# Patient Record
Sex: Male | Born: 1935 | Race: White | Hispanic: No | Smoking: Former smoker
Health system: Southern US, Community
[De-identification: ages and names within clinical notes are randomized; demographics above are authoritative.]

## PROBLEM LIST (undated history)

## (undated) DIAGNOSIS — J449 Chronic obstructive pulmonary disease, unspecified: Secondary | ICD-10-CM

## (undated) DIAGNOSIS — I10 Essential (primary) hypertension: Secondary | ICD-10-CM

## (undated) DIAGNOSIS — H269 Unspecified cataract: Secondary | ICD-10-CM

## (undated) HISTORY — PX: CATARACT EXTRACTION: SUR2

---

## 1966-07-27 HISTORY — PX: HAND SURGERY: SHX662

## 2017-02-06 ENCOUNTER — Encounter (HOSPITAL_COMMUNITY): Payer: Self-pay | Admitting: Emergency Medicine

## 2017-02-06 ENCOUNTER — Emergency Department (HOSPITAL_COMMUNITY)
Admission: EM | Admit: 2017-02-06 | Discharge: 2017-02-06 | Disposition: A | Discharge status: Home / Self Care | Payer: Medicare HMO | Attending: Emergency Medicine | Admitting: Emergency Medicine

## 2017-02-06 DIAGNOSIS — N3000 Acute cystitis without hematuria: Secondary | ICD-10-CM | POA: Diagnosis not present

## 2017-02-06 DIAGNOSIS — Z87891 Personal history of nicotine dependence: Secondary | ICD-10-CM | POA: Insufficient documentation

## 2017-02-06 DIAGNOSIS — I10 Essential (primary) hypertension: Secondary | ICD-10-CM | POA: Insufficient documentation

## 2017-02-06 DIAGNOSIS — R531 Weakness: Secondary | ICD-10-CM | POA: Diagnosis present

## 2017-02-06 DIAGNOSIS — J449 Chronic obstructive pulmonary disease, unspecified: Secondary | ICD-10-CM | POA: Insufficient documentation

## 2017-02-06 HISTORY — DX: Essential (primary) hypertension: I10

## 2017-02-06 HISTORY — DX: Chronic obstructive pulmonary disease, unspecified: J44.9

## 2017-02-06 LAB — URINALYSIS, ROUTINE W REFLEX MICROSCOPIC
Bilirubin Urine: NEGATIVE
GLUCOSE, UA: NEGATIVE mg/dL
Ketones, ur: NEGATIVE mg/dL
NITRITE: POSITIVE — AB
PH: 5 (ref 5.0–8.0)
Protein, ur: 100 mg/dL — AB
Specific Gravity, Urine: 1.021 (ref 1.005–1.030)

## 2017-02-06 LAB — CBC
HEMATOCRIT: 41.4 % (ref 39.0–52.0)
Hemoglobin: 14.2 g/dL (ref 13.0–17.0)
MCH: 30.1 pg (ref 26.0–34.0)
MCHC: 34.3 g/dL (ref 30.0–36.0)
MCV: 87.9 fL (ref 78.0–100.0)
PLATELETS: 193 10*3/uL (ref 150–400)
RBC: 4.71 MIL/uL (ref 4.22–5.81)
RDW: 13.7 % (ref 11.5–15.5)
WBC: 18.6 10*3/uL — AB (ref 4.0–10.5)

## 2017-02-06 LAB — BASIC METABOLIC PANEL
Anion gap: 9 (ref 5–15)
BUN: 15 mg/dL (ref 6–20)
CO2: 25 mmol/L (ref 22–32)
CREATININE: 1.19 mg/dL (ref 0.61–1.24)
Calcium: 8.7 mg/dL — ABNORMAL LOW (ref 8.9–10.3)
Chloride: 100 mmol/L — ABNORMAL LOW (ref 101–111)
GFR, EST NON AFRICAN AMERICAN: 57 mL/min — AB (ref >60)
Glucose, Bld: 137 mg/dL — ABNORMAL HIGH (ref 65–99)
POTASSIUM: 3.8 mmol/L (ref 3.5–5.1)
SODIUM: 134 mmol/L — AB (ref 135–145)

## 2017-02-06 LAB — I-STAT CG4 LACTIC ACID, ED: Lactic Acid, Venous: 1.39 mmol/L (ref 0.5–1.9)

## 2017-02-06 LAB — CBG MONITORING, ED: Glucose-Capillary: 126 mg/dL — ABNORMAL HIGH (ref 65–99)

## 2017-02-06 MED ORDER — SODIUM CHLORIDE 0.9 % IV BOLUS (SEPSIS)
1000.0000 mL | Freq: Once | INTRAVENOUS | Status: AC
  Administered 2017-02-06: 1000 mL via INTRAVENOUS

## 2017-02-06 MED ORDER — ACETAMINOPHEN 500 MG PO TABS
1000.0000 mg | ORAL_TABLET | Freq: Once | ORAL | Status: AC
  Administered 2017-02-06: 1000 mg via ORAL
  Filled 2017-02-06: qty 2

## 2017-02-06 MED ORDER — CEPHALEXIN 500 MG PO CAPS: 500.0000 mg | ORAL_CAPSULE | Freq: Three times a day (TID) | ORAL | 0 refills | Status: DC

## 2017-02-06 MED ORDER — DEXTROSE 5 % IV SOLN
1.0000 g | Freq: Once | INTRAVENOUS | Status: AC
  Administered 2017-02-06: 1 g via INTRAVENOUS
  Filled 2017-02-06: qty 10

## 2017-02-06 MED ORDER — IBUPROFEN 400 MG PO TABS
400.0000 mg | ORAL_TABLET | Freq: Once | ORAL | Status: AC
  Administered 2017-02-06: 400 mg via ORAL
  Filled 2017-02-06: qty 1

## 2017-02-06 NOTE — ED Provider Notes (Signed)
[facility]-EMERGENCY DEPT Provider Note   CSN: [id number] Arrival date & time: 02/06/17  1315     History   Chief Complaint Chief Complaint  Patient presents with  . Weakness    HPI Michael Bailey is a 81 y.o. male.  HPI  The patient is a 81 year old male, history of COPD and hypertension, and otherwise good health, lives by himself, noticed that yesterday he started having fevers chills and generalized weakness however has had some worsening symptoms this morning. He also reports being up over 15 times last night to use the bathroom, urinating frequently, dysuria but no lower back pain or abdominal pain. His family brings him to the hospital stating that he usually does not come to the hospital because he is fairly stoic and usually refuses any interventions. The patient denies having any rashes but has had a couple of tick bites over the last week.  Past Medical History:  Diagnosis Date  . COPD (chronic obstructive pulmonary disease) (HCC)   . Hypertension     There are no active problems to display for this patient.   History reviewed. No pertinent surgical history.     Home Medications    Prior to Admission medications   Medication Sig Start Date End Date Taking? Authorizing Provider  aspirin 500 MG tablet Take 500 mg by mouth every 6 (six) hours as needed for pain (BP).   Yes [provider]  thiamine (VITAMIN B-1) 100 MG tablet Take 100 mg by mouth daily.   Yes [provider]  cephALEXin (KEFLEX) 500 MG capsule Take 1 capsule (500 mg total) by mouth 3 (three) times daily. 02/06/17   Eber HongMiller, Shailene Demonbreun, MD    Family History No family history on file.  Social History Social History  Substance Use Topics  . Smoking status: Former Games developermoker  . Smokeless tobacco: Never Used  . Alcohol use No     Allergies   Patient has no known allergies.   Review of Systems Review of Systems  All other systems reviewed and are negative.    Physical Exam Updated  Vital Signs BP (!) 122/56   Pulse 84   Temp 98.3 F (36.8 C)   Resp (!) 23   Ht 6\' 2"  (1.88 m)   Wt 127 kg (280 lb)   SpO2 97%   BMI 35.95 kg/m   Physical Exam  Constitutional: He appears well-developed and well-nourished. No distress.  HENT:  Head: Normocephalic and atraumatic.  Mouth/Throat: Oropharynx is clear and moist. No oropharyngeal exudate.  Eyes: Pupils are equal, round, and reactive to light. Conjunctivae and EOM are normal. Right eye exhibits no discharge. Left eye exhibits no discharge. No scleral icterus.  Neck: Normal range of motion. Neck supple. No JVD present. No thyromegaly present.  Cardiovascular: Regular rhythm, normal heart sounds and intact distal pulses.  Exam reveals no gallop and no friction rub.   No murmur heard. Pulse of 100 on my exam, normal radial artery pulses  Pulmonary/Chest: Effort normal and breath sounds normal. No respiratory distress. He has no wheezes. He has no rales.  Abdominal: Soft. Bowel sounds are normal. He exhibits no distension and no mass. There is no tenderness.  No abdominal tenderness, very soft, no guarding, no pulsatile masses  Musculoskeletal: Normal range of motion. He exhibits no edema or tenderness.  No edema, moves all 4 extremities without any difficulty, he has the absence of 2 fingers of the right hand which she states were lost 20 was younger to an  industrial accident, all other compartments are soft, joints are supple, no effusions  Lymphadenopathy:    He has no cervical adenopathy.  Neurological: He is alert. Coordination normal.  The patient is able to interact appropriately with normal function, has normal coordination, normal strength in all 4 extremities, is able to sit up by himself in the bed for the exam, cranial nerves III through XII appear normal, there is no lateralizing weakness  Skin: Skin is warm and dry. No rash noted. No erythema.  There is no rash on the skin  Psychiatric: He has a normal mood and  affect. His behavior is normal.  Nursing note and vitals reviewed.    ED Treatments / Results  Labs (all labs ordered are listed, but only abnormal results are displayed) Labs Reviewed  BASIC METABOLIC PANEL - Abnormal; Notable for the following:       Result Value   Sodium 134 (*)    Chloride 100 (*)    Glucose, Bld 137 (*)    Calcium 8.7 (*)    GFR calc non Af Amer 57 (*)    All other components within normal limits  CBC - Abnormal; Notable for the following:    WBC 18.6 (*)    All other components within normal limits  URINALYSIS, ROUTINE W REFLEX MICROSCOPIC - Abnormal; Notable for the following:    Color, Urine AMBER (*)    APPearance CLOUDY (*)    Hgb urine dipstick MODERATE (*)    Protein, ur 100 (*)    Nitrite POSITIVE (*)    Leukocytes, UA MODERATE (*)    Bacteria, UA FEW (*)    Squamous Epithelial / LPF 0-5 (*)    Non Squamous Epithelial 0-5 (*)    All other components within normal limits  CBG MONITORING, ED - Abnormal; Notable for the following:    Glucose-Capillary 126 (*)    All other components within normal limits  URINE CULTURE  I-STAT CG4 LACTIC ACID, ED    Radiology No results found.  Procedures Procedures (including critical care time)  Medications Ordered in ED Medications  sodium chloride 0.9 % bolus 1,000 mL (0 mLs Intravenous Stopped 02/06/17 1458)  acetaminophen (TYLENOL) tablet 1,000 mg (1,000 mg Oral Given 02/06/17 1355)  ibuprofen (ADVIL,MOTRIN) tablet 400 mg (400 mg Oral Given 02/06/17 1355)  cefTRIAXone (ROCEPHIN) 1 g in dextrose 5 % 50 mL IVPB (1 g Intravenous New Bag/Given 02/06/17 1453)     Initial Impression / Assessment and Plan / ED Course  I have reviewed the triage vital signs and the nursing notes.  Pertinent labs & imaging results that were available during my care of the patient were reviewed by me and considered in my medical decision making (see chart for details).     The patient is febrile, mildly tachycardic, I  suspect he has a urinary tract infection of some kind. Urinalysis pending, he does not appear clinically toxic however he will need some IV fluids and likely antibiotics. Check renal function as well.  White blood cell count of 18,600, urinalysis shows too numerous to count white blood cells with bacteria and a positive nitrite. The patient was given Rocephin, Tylenol, IV fluids and has improved significantly. He is now asymptomatic with no back pain, no belly pain, no fever, no nausea. His vital signs of improved significantly with a pulse of 83, blood pressure is normal, lactic acid is normal, renal function is normal. I discussed all of these findings with the patient and his  family members after he gave me permission. He appears stable for discharge, culture has been sent, he will follow-up with a local family doctor. He does not have one so I have given him a follow-up list for outpatient referral. He expressed his understanding and has agreed to return should his symptoms worsen.  Final Clinical Impressions(s) / ED Diagnoses   Final diagnoses:  Acute cystitis without hematuria    New Prescriptions New Prescriptions   CEPHALEXIN (KEFLEX) 500 MG CAPSULE    Take 1 capsule (500 mg total) by mouth 3 (three) times daily.     Eber Hong, MD 02/06/17 [phone]

## 2017-02-06 NOTE — ED Triage Notes (Signed)
Pt c/o generalized weakness, dizziness x 2 days with fever/chills today. Pt also c/o dysuria and polyuria.

## 2017-02-06 NOTE — Discharge Instructions (Signed)
Please obtain all of your results from medical records or have your doctors office obtain the results - share them with your doctor - you should be seen at your doctors office in the next 2 days. Call today to arrange your follow up. Take the medications as prescribed. Please review all of the medicines and only take them if you do not have an allergy to them. Please be aware that if you are taking birth control pills, taking other prescriptions, ESPECIALLY ANTIBIOTICS may make the birth control ineffective - if this is the case, either do not engage in sexual activity or use alternative methods of birth control such as condoms until you have finished the medicine and your family doctor says it is OK to restart them. If you are on a blood thinner such as COUMADIN, be aware that any other medicine that you take may cause the coumadin to either work too much, or not enough - you should have your coumadin level rechecked in next 7 days if this is the case.  ?  It is also a possibility that you have an allergic reaction to any of the medicines that you have been prescribed - Everybody reacts differently to medications and while MOST people have no trouble with most medicines, you may have a reaction such as nausea, vomiting, rash, swelling, shortness of breath. If this is the case, please stop taking the medicine immediately and contact your physician.  ?  You should return to the ER if you develop severe or worsening symptoms.   Keflex 3 times a day (with food) for 7 days Tylenol or Motrin for fevers See a local doctor in next week for follow up.  See list below.  [facility] Primary Care Doctor List    Kari Baars MD. Specialty: Pulmonary Disease Contact information: [address]  [address]  [place] [address]  [phone]   Syliva Overman, MD. Specialty: [facility] Medicine Contact information: [address]  [place] [address]  [phone]   Lilyan Punt, MD.  Specialty: [facility] Medicine Contact information: [address]  [place] [address]  [phone]   Avon Gully, MD Specialty: Internal Medicine Contact information: [address] [place] [address]  [phone]   Catalina Pizza, MD. Specialty: Internal Medicine Contact information: [address]  [place] [address]  [phone]    [facility] (Dr. Selena Batten) Specialty: Family Medicine Contact information: [address]  [place] [address]  [phone]   John Giovanni, MD. Specialty: [facility] Medicine Contact information: [address]  [address]  [place] [address]  [phone]   Carylon Perches, MD. Specialty: Internal Medicine Contact information: [address]  [address]  [place] [address]  [phone]    [facility]  [address] [place], [place] [address] [phone]  Services [facility] offers a variety of basic health services.  Services include but are not limited to: Blood pressure checks  Heart rate checks  Blood sugar checks  Urine analysis  Rapid strep tests  Pregnancy tests.  Health education and referrals  People needing more complex services will be directed to a physician online. Using these virtual visits, doctors can evaluate and prescribe medicine and treatments. There will be no medication on-site, though [facility] will help patients fill their prescriptions at little to no cost.   For  More information please go to: [email or web address]

## 2017-02-08 LAB — URINE CULTURE

## 2017-02-09 ENCOUNTER — Telehealth: Payer: Self-pay | Admitting: *Deleted

## 2017-02-09 NOTE — Telephone Encounter (Signed)
Call back received from patient.  Bactrim DS 1 tab PO BID x 7 days [facility] [phone]

## 2017-02-09 NOTE — Progress Notes (Signed)
ED Antimicrobial Stewardship Positive Culture Follow Up   Michael Bailey is an 81 y.o. male who [facility] with a chief complaint of  Chief Complaint  Patient presents with  . Weakness    Recent Results (from the past 720 hour(s))  Urine Culture     Status: Abnormal   Collection Time: 02/06/17  1:41 PM  Result Value Ref Range Status   Specimen Description URINE, CLEAN CATCH  Final   Special Requests NONE  Final   Culture >=100,000 COLONIES/mL ENTEROBACTER AEROGENES (A)  Final   Report Status 02/08/2017 FINAL  Final   Organism ID, Bacteria ENTEROBACTER AEROGENES (A)  Final      Susceptibility   Enterobacter aerogenes - MIC*    CEFAZOLIN >=64 RESISTANT Resistant     CEFTRIAXONE <=1 SENSITIVE Sensitive     CIPROFLOXACIN <=0.25 SENSITIVE Sensitive     GENTAMICIN <=1 SENSITIVE Sensitive     IMIPENEM 2 SENSITIVE Sensitive     NITROFURANTOIN 64 INTERMEDIATE Intermediate     TRIMETH/SULFA <=20 SENSITIVE Sensitive     PIP/TAZO <=4 SENSITIVE Sensitive     * >=100,000 COLONIES/mL ENTEROBACTER AEROGENES    [x]  Treated with Keflex, organism resistant to prescribed antimicrobial  New antibiotic prescription: Bactrim DS 1 tab bid x 7 days  ED Provider: Claudine MoutonJaimie Ward, PA-C   Rolley SimsMartin, Naiara Lombardozzi Ann 02/09/2017, 9:38 AM Infectious Diseases Pharmacist Phone# [phone]

## 2017-02-09 NOTE — Telephone Encounter (Signed)
Post ED Visit - Positive Culture Follow-up: Unsuccessful Patient Follow-up  Culture assessed and recommendations reviewed by:  []  Enzo BiNathan Batchelder, Pharm.D. []  Celedonio MiyamotoJeremy Frens, Pharm.D., BCPS AQ-ID []  Garvin FilaMike Maccia, Pharm.D., BCPS [x]  Georgina PillionElizabeth Martin, Pharm.D., BCPS []  Re[place]olds[place]Minh Pham, VermontPharm.D., BCPS, AAHIVP []  Estella HuskMichelle Turner, Pharm.D., BCPS, AAHIVP []  Michael Pearlachel Rumbarger, PharmD, BCPS []  Casilda Carlsaylor Stone, PharmD, BCPS []  Pollyann SamplesAndy Johnston, PharmD, BCPS  Positive urine culture  []  Patient discharged without antimicrobial prescription and treatment is now indicated [x]  Organism is resistant to prescribed ED discharge antimicrobial []  Patient with positive blood cultures   Unable to contact patient after 3 attempts, letter will be sent to address on file  Michael Bailey, Michael Bailey 02/09/2017, 10:31 AM

## 2019-07-03 ENCOUNTER — Ambulatory Visit
Admission: EM | Admit: 2019-07-03 | Discharge: 2019-07-03 | Disposition: A | Discharge status: Home / Self Care | Payer: Medicare HMO | Attending: Emergency Medicine | Admitting: Emergency Medicine

## 2019-07-03 ENCOUNTER — Other Ambulatory Visit: Payer: Self-pay

## 2019-07-03 DIAGNOSIS — T161XXA Foreign body in right ear, initial encounter: Secondary | ICD-10-CM | POA: Diagnosis not present

## 2019-07-03 DIAGNOSIS — H9201 Otalgia, right ear: Secondary | ICD-10-CM | POA: Diagnosis not present

## 2019-07-03 DIAGNOSIS — R030 Elevated blood-pressure reading, without diagnosis of hypertension: Secondary | ICD-10-CM | POA: Diagnosis not present

## 2019-07-03 NOTE — Discharge Instructions (Signed)
Foreign body removed with ear lavage Rest and drink plenty of fluids Take medications as directed and to completion Continue to use OTC ibuprofen and/ or tylenol as needed for pain control Follow up with PCP if symptoms persists Return here or go to the ER if you have any new or worsening symptoms fever, chills, nausea, vomiting, worsening symptoms despite treatment, etc...  Blood pressure elevated in office.  Please recheck in 24 hours.  If it continues to be greater than 140/90 please follow up with PCP for further evaluation and management.

## 2019-07-03 NOTE — ED Provider Notes (Signed)
[facility]   [id number] 07/03/19 Arrival Time: 46  CC: EAR PAIN; FB in ear  SUBJECTIVE: History from: patient.  Hanley Woerner is a 83 y.o. male who presents with of right ear pain x 1 day.  Symptoms began after getting a piece of cotton swab stuck in his ear.  Patient states the pain is constant and achy in character.  3/10.  Patient has not tried removing at home. Symptoms are made worse to the touch.  Denies similar symptoms in the past.    Denies fever, chills, fatigue, sinus pain, rhinorrhea, ear discharge, sore throat, SOB, wheezing, chest pain, nausea, changes in bowel or bladder habits.    ROS: As per HPI.  All other pertinent ROS negative.     Past Medical History:  Diagnosis Date  . COPD (chronic obstructive pulmonary disease) ([facility])   . Hypertension    History reviewed. No pertinent surgical history. No Known Allergies No current facility-administered medications on file prior to encounter.    Current Outpatient Medications on File Prior to Encounter  Medication Sig Dispense Refill  . aspirin 500 MG tablet Take 500 mg by mouth every 6 (six) hours as needed for pain (BP).    . thiamine (VITAMIN B-1) 100 MG tablet Take 100 mg by mouth daily.     Social History   Socioeconomic History  . Marital status: Married    Spouse name: Not on file  . Number of children: Not on file  . Years of education: Not on file  . Highest education level: Not on file  Occupational History  . Not on file  Social Needs  . Financial resource strain: Not on file  . Food insecurity    Worry: Not on file    Inability: Not on file  . Transportation needs    Medical: Not on file    Non-medical: Not on file  Tobacco Use  . Smoking status: Former Research scientist (life sciences)  . Smokeless tobacco: Never Used  Substance and Sexual Activity  . Alcohol use: No  . Drug use: Not on file  . Sexual activity: Not on file  Lifestyle  . Physical activity    Days per week: Not on file    Minutes per  session: Not on file  . Stress: Not on file  Relationships  . Social Herbalist on phone: Not on file    Gets together: Not on file    Attends religious service: Not on file    Active member of club or organization: Not on file    Attends meetings of clubs or organizations: Not on file    Relationship status: Not on file  . Intimate partner violence    Fear of current or ex partner: Not on file    Emotionally abused: Not on file    Physically abused: Not on file    Forced sexual activity: Not on file  Other Topics Concern  . Not on file  Social History Narrative  . Not on file   Family History  Problem Relation Age of Onset  . Healthy Mother   . Healthy Father     OBJECTIVE:  Vitals:   07/03/19 1642  BP: (!) 192/90  Pulse: 70  Resp: 16  Temp: 97.6 F (36.4 C)  TempSrc: Oral  SpO2: 98%     General appearance: alert; well-appearing, just appears mildly uncomfortable, but nontoxic HEENT: NCAT; Ears: LT EAC clear, TMs pearly gray with visible cone of light, without erythema,  RT EAC obstructed; Eyes: PERRL, EOMI grossly; Nose: patent without rhinorrhea; Throat: oropharynx clear, uvula midline Neck: supple without LAD Lungs: unlabored respirations, symmetrical air entry; cough: absent; no respiratory distress Heart: regular rate and rhythm.  Skin: warm and dry Psychological: alert and cooperative; normal mood and affect  PROCEDURE: Failed manual removal with curette and forceps.  Consent granted.  Right ear lavage performed by RT Katie.  Cotton swab removed.  TM partially visualized.  Mild cotton residue present.  PT tolerated procedure well.    ASSESSMENT & PLAN:  1. Foreign body in right ear, initial encounter   2. Elevated blood pressure reading    Foreign body removed with ear lavage Rest and drink plenty of fluids Take medications as directed and to completion Continue to use OTC ibuprofen and/ or tylenol as needed for pain control Follow up with  PCP if symptoms persists Return here or go to the ER if you have any new or worsening symptoms fever, chills, nausea, vomiting, worsening symptoms despite treatment, etc...  Blood pressure elevated in office.  Please recheck in 24 hours.  If it continues to be greater than 140/90 please follow up with PCP for further evaluation and management.    Reviewed expectations re: course of current medical issues. Questions answered. Outlined signs and symptoms indicating need for more acute intervention. Patient verbalized understanding. After Visit Summary given.         Rennis Harding, PA-C 07/03/19 2049

## 2019-07-03 NOTE — ED Triage Notes (Addendum)
Pt presents to UC w/ c/o cotton swab in right ear since yesterday. Pt states he was using it to dry water out of ear after shower and it broke off into right ear. Pt c/o pain in right ear, especially when touching it

## 2020-02-15 ENCOUNTER — Emergency Department (HOSPITAL_COMMUNITY)
Admission: EM | Admit: 2020-02-15 | Discharge: 2020-02-15 | Disposition: A | Discharge status: Home / Self Care | Payer: Medicare HMO | Attending: Emergency Medicine | Admitting: Emergency Medicine

## 2020-02-15 ENCOUNTER — Other Ambulatory Visit: Payer: Self-pay

## 2020-02-15 ENCOUNTER — Encounter (HOSPITAL_COMMUNITY): Payer: Self-pay

## 2020-02-15 ENCOUNTER — Emergency Department (HOSPITAL_COMMUNITY): Payer: Medicare HMO

## 2020-02-15 ENCOUNTER — Telehealth: Payer: Self-pay | Admitting: Nurse Practitioner

## 2020-02-15 DIAGNOSIS — R0602 Shortness of breath: Secondary | ICD-10-CM | POA: Diagnosis present

## 2020-02-15 DIAGNOSIS — U071 COVID-19: Secondary | ICD-10-CM | POA: Insufficient documentation

## 2020-02-15 DIAGNOSIS — J449 Chronic obstructive pulmonary disease, unspecified: Secondary | ICD-10-CM | POA: Diagnosis not present

## 2020-02-15 DIAGNOSIS — I10 Essential (primary) hypertension: Secondary | ICD-10-CM | POA: Insufficient documentation

## 2020-02-15 DIAGNOSIS — Z87891 Personal history of nicotine dependence: Secondary | ICD-10-CM | POA: Insufficient documentation

## 2020-02-15 DIAGNOSIS — Z7982 Long term (current) use of aspirin: Secondary | ICD-10-CM | POA: Diagnosis not present

## 2020-02-15 LAB — CBC WITH DIFFERENTIAL/PLATELET
Abs Immature Granulocytes: 0.02 10*3/uL (ref 0.00–0.07)
Basophils Absolute: 0 10*3/uL (ref 0.0–0.1)
Basophils Relative: 0 %
Eosinophils Absolute: 0 10*3/uL (ref 0.0–0.5)
Eosinophils Relative: 0 %
HCT: 42.4 % (ref 39.0–52.0)
Hemoglobin: 13.7 g/dL (ref 13.0–17.0)
Immature Granulocytes: 1 %
Lymphocytes Relative: 12 %
Lymphs Abs: 0.3 10*3/uL — ABNORMAL LOW (ref 0.7–4.0)
MCH: 28.9 pg (ref 26.0–34.0)
MCHC: 32.3 g/dL (ref 30.0–36.0)
MCV: 89.5 fL (ref 80.0–100.0)
Monocytes Absolute: 0.3 10*3/uL (ref 0.1–1.0)
Monocytes Relative: 11 %
Neutro Abs: 2.1 10*3/uL (ref 1.7–7.7)
Neutrophils Relative %: 76 %
Platelets: 106 10*3/uL — ABNORMAL LOW (ref 150–400)
RBC: 4.74 MIL/uL (ref 4.22–5.81)
RDW: 14.5 % (ref 11.5–15.5)
WBC: 2.8 10*3/uL — ABNORMAL LOW (ref 4.0–10.5)
nRBC: 0 % (ref 0.0–0.2)

## 2020-02-15 LAB — TROPONIN I (HIGH SENSITIVITY)
Troponin I (High Sensitivity): 19 ng/L — ABNORMAL HIGH (ref <18)
Troponin I (High Sensitivity): 23 ng/L — ABNORMAL HIGH (ref <18)

## 2020-02-15 LAB — SAMPLE TO BLOOD BANK

## 2020-02-15 LAB — SARS CORONAVIRUS 2 BY RT PCR (HOSPITAL ORDER, PERFORMED IN ~~LOC~~ HOSPITAL LAB): SARS Coronavirus 2: POSITIVE — AB

## 2020-02-15 LAB — COMPREHENSIVE METABOLIC PANEL
ALT: 15 U/L (ref 0–44)
AST: 21 U/L (ref 15–41)
Albumin: 3.6 g/dL (ref 3.5–5.0)
Alkaline Phosphatase: 55 U/L (ref 38–126)
Anion gap: 13 (ref 5–15)
BUN: 20 mg/dL (ref 8–23)
CO2: 23 mmol/L (ref 22–32)
Calcium: 8.4 mg/dL — ABNORMAL LOW (ref 8.9–10.3)
Chloride: 100 mmol/L (ref 98–111)
Creatinine, Ser: 1.23 mg/dL (ref 0.61–1.24)
GFR calc Af Amer: >60 mL/min (ref >60)
GFR calc non Af Amer: 55 mL/min — ABNORMAL LOW (ref >60)
Glucose, Bld: 114 mg/dL — ABNORMAL HIGH (ref 70–99)
Potassium: 3.8 mmol/L (ref 3.5–5.1)
Sodium: 136 mmol/L (ref 135–145)
Total Bilirubin: 0.7 mg/dL (ref 0.3–1.2)
Total Protein: 7.1 g/dL (ref 6.5–8.1)

## 2020-02-15 LAB — BRAIN NATRIURETIC PEPTIDE: B Natriuretic Peptide: 68 pg/mL (ref 0.0–100.0)

## 2020-02-15 LAB — LACTIC ACID, PLASMA
Lactic Acid, Venous: 1.2 mmol/L (ref 0.5–1.9)
Lactic Acid, Venous: 0.8 mmol/L (ref 0.5–1.9)

## 2020-02-15 MED ORDER — ACETAMINOPHEN 325 MG PO TABS
650.0000 mg | ORAL_TABLET | Freq: Once | ORAL | Status: AC
  Administered 2020-02-15: 10:00:00 650 mg via ORAL
  Filled 2020-02-15: qty 2

## 2020-02-15 MED ORDER — SODIUM CHLORIDE 0.9 % IV BOLUS
1000.0000 mL | Freq: Once | INTRAVENOUS | Status: AC
  Administered 2020-02-15: 1000 mL via INTRAVENOUS

## 2020-02-15 NOTE — Discharge Instructions (Addendum)
Drink plenty of fluids and get plenty of rest.  Tylenol 1000 mg every 6 hours as needed for pain or fever.  The monoclonal antibody clinic will contact you/your daughter to make arrangements for infusion.

## 2020-02-15 NOTE — Telephone Encounter (Signed)
Called to Discuss with patient about Covid symptoms and the use of bamlanivimab, a monoclonal antibody infusion for those with mild to moderate Covid symptoms and at a high risk of hospitalization.     Pt is qualified for this infusion at the [facility] infusion center due to co-morbid conditions and/or a member of an at-risk group.     Unable to reach pt's daughter who is listed as contact.

## 2020-02-15 NOTE — Clinical Social Work Note (Signed)
Transition of Care [facility]) - Emergency Department Mini Assessment  Patient Details  Name: Michael Bailey MRN: [id number] Date of Birth: 1935/11/20  [facility]) CM/SW Contact:    Ewing Schlein, LCSW Phone Number: 02/15/2020, 2:44 PM  Clinical Narrative: Patient is an 84 year old male who presented to the ED COVID +. Per chart review, patient has insurance but no PCP. CSW attempted to call patient, but was unable to reach him. CSW spoke with daughter, Michael Bailey, to discuss PCP resource list. Per daughter, the patient does not like to have a PCP and prefers to go to urgent care when sick. Daughter declined assistance at this time. TOC signing off.  ED Mini Assessment: What brought you to the Emergency Department? : COVID + Barriers to Discharge: ED No Barriers Barrier interventions: Discussed PCP options with patient's daughter, [name] Means of departure: Car Interventions which prevented an admission or readmission: Other (must enter comment) (Offered PCP resources)  Patient Contact and Communications Key Contact 1: Ron Agee Spoke with: Daughter Contact Date: 02/15/20  Contact time: 1433 Call outcome: Daughter declined PCP resources Patient states their goals for this hospitalization and ongoing recovery are:: Return home  Admission diagnosis:  covid There are no problems to display for this patient.  PCP:  Patient, No Pcp Per Pharmacy:   [id number] - [place], [place] - [address] [place] [address] Phone: [phone] Fax: [phone]  [id number] - [place], [place] - [address] [place] [address] Phone: [phone] Fax: [phone]

## 2020-02-15 NOTE — ED Triage Notes (Signed)
EMS reports pt tested positive for covid yesterday.  C/O generalized weakness. Pt says he got a covid vaccine Monday.

## 2020-02-15 NOTE — ED Provider Notes (Signed)
[facility] EMERGENCY DEPARTMENT Provider Note   CSN: [id number] Arrival date & time: 02/15/20  8341     History Chief Complaint  Patient presents with   Covid    Michael Bailey is a 84 y.o. male.  Patient is an 84 year old male with history of hypertension not currently taking any medications.  He is brought by EMS for evaluation of shortness of breath.  Patient apparently had a positive Covid test yesterday.  He does describe some nonproductive cough.  He denies to me he is having any chest pain.  Patient has little medical history, takes no medications, and according to the daughter has not seen a physician in many years.  The history is provided by the patient.       Past Medical History:  Diagnosis Date   COPD (chronic obstructive pulmonary disease) (HCC)    Hypertension     There are no problems to display for this patient.   History reviewed. No pertinent surgical history.     Family History  Problem Relation Age of Onset   Healthy Mother    Healthy Father     Social History   Tobacco Use   Smoking status: Former Smoker   Smokeless tobacco: Never Used  Substance Use Topics   Alcohol use: No   Drug use: Not on file    Home Medications Prior to Admission medications   Medication Sig Start Date End Date Taking? Authorizing Provider  aspirin 500 MG tablet Take 500 mg by mouth every 6 (six) hours as needed for pain (BP).    [provider]  thiamine (VITAMIN B-1) 100 MG tablet Take 100 mg by mouth daily.    [provider]    Allergies    Patient has no known allergies.  Review of Systems   Review of Systems  All other systems reviewed and are negative.   Physical Exam Updated Vital Signs BP (!) 147/95    Pulse 84    Temp (!) 101.4 F (38.6 C) (Oral)    Resp 19    Ht 6\' 2"  (1.88 m)    Wt 77.1 kg    SpO2 96%    BMI 21.83 kg/m   Physical Exam Vitals and nursing note reviewed.  Constitutional:      General: He is not  in acute distress.    Appearance: He is well-developed. He is not diaphoretic.  HENT:     Head: Normocephalic and atraumatic.  Cardiovascular:     Rate and Rhythm: Normal rate and regular rhythm.     Heart sounds: No murmur heard.  No friction rub.  Pulmonary:     Effort: Pulmonary effort is normal. No respiratory distress.     Breath sounds: Normal breath sounds. No wheezing or rales.  Abdominal:     General: Bowel sounds are normal. There is no distension.     Palpations: Abdomen is soft.     Tenderness: There is no abdominal tenderness.  Musculoskeletal:        General: No swelling. Normal range of motion.     Cervical back: Normal range of motion and neck supple.     Right lower leg: No edema.     Left lower leg: No edema.  Skin:    General: Skin is warm and dry.  Neurological:     Mental Status: He is alert and oriented to person, place, and time.     Coordination: Coordination normal.     ED Results / Procedures /  Treatments   Labs (all labs ordered are listed, but only abnormal results are displayed) Labs Reviewed  SARS CORONAVIRUS 2 BY RT PCR (HOSPITAL ORDER, PERFORMED IN [facility]) - Abnormal; Notable for the following components:      Result Value   SARS Coronavirus 2 POSITIVE (*)    All other components within normal limits  COMPREHENSIVE METABOLIC PANEL - Abnormal; Notable for the following components:   Glucose, Bld 114 (*)    Calcium 8.4 (*)    GFR calc non Af Amer 55 (*)    All other components within normal limits  CBC WITH DIFFERENTIAL/PLATELET - Abnormal; Notable for the following components:   WBC 2.8 (*)    Platelets 106 (*)    Lymphs Abs 0.3 (*)    All other components within normal limits  TROPONIN I (HIGH SENSITIVITY) - Abnormal; Notable for the following components:   Troponin I (High Sensitivity) 19 (*)    All other components within normal limits  TROPONIN I (HIGH SENSITIVITY) - Abnormal; Notable for the following components:     Troponin I (High Sensitivity) 23 (*)    All other components within normal limits  CULTURE, BLOOD (ROUTINE X 2)  CULTURE, BLOOD (ROUTINE X 2)  URINE CULTURE  BRAIN NATRIURETIC PEPTIDE  LACTIC ACID, PLASMA  LACTIC ACID, PLASMA  URINALYSIS, ROUTINE W REFLEX MICROSCOPIC  SAMPLE TO BLOOD BANK    EKG None  Radiology DG Chest Port 1 View  Result Date: 02/15/2020 CLINICAL DATA:  Shortness of breath and weakness.  COVID positive. EXAM: PORTABLE CHEST 1 VIEW COMPARISON:  No prior. FINDINGS: Mild upper mediastinal prominence, most likely secondary to AP projection and prominent great vessels. Follow-up PA lateral chest x-ray suggested heart size normal. Diffuse bilateral interstitial prominence consistent with pneumonitis in this known COVID positive patient. No pleural effusion or pneumothorax. No acute bony abnormality. IMPRESSION: 1. Diffuse bilateral interstitial prominence noted consistent with pneumonitis in this known COVID positive patient. 2. Mild prominence of the upper mediastinum most likely secondary to AP projection and prominent great vessels. Electronically Signed   By: Maisie Fus  Register   On: 02/15/2020 10:19    Procedures Procedures (including critical care time)  Medications Ordered in ED Medications  acetaminophen (TYLENOL) tablet 650 mg (650 mg Oral Given 02/15/20 0941)  sodium chloride 0.9 % bolus 1,000 mL (1,000 mLs Intravenous New Bag/Given 02/15/20 1324)    ED Course  I have reviewed the triage vital signs and the nursing notes.  Pertinent labs & imaging results that were available during my care of the patient were reviewed by me and considered in my medical decision making (see chart for details).    MDM Rules/Calculators/A&P  Patient presenting here with complaints of weakness and shortness of breath.  He was diagnosed with COVID-19 yesterday.  Patient arrives here febrile, but with stable vital signs and adequate oxygen saturations.  He appears clinically  well.  His work-up shows no significant abnormality.  I see no definitive indication for admission.  I have spoken with the patient's daughter, Michael Bailey regarding the disposition.  I feel as though the patient is appropriate for discharge.  Patient is comfortable with this and daughter is as well.  I have made a referral to the monoclonal antibody infusion clinic as I feel the patient is a candidate for this.  They have informed me they will reach out to the daughter and make arrangements for him to receive this therapy.  Final Clinical Impression(s) / ED Diagnoses  Final diagnoses:  None    Rx / DC Orders ED Discharge Orders    None       Geoffery Lyons, MD 02/16/20 [phone]

## 2020-02-16 ENCOUNTER — Telehealth: Payer: Self-pay | Admitting: Nurse Practitioner

## 2020-02-16 NOTE — Telephone Encounter (Signed)
Called to Discuss with patient about Covid symptoms and the use of bamlanivimab, a monoclonal antibody infusion for those with mild to moderate Covid symptoms and at a high risk of hospitalization.     Pt is qualified for this infusion at the [facility] due to co-morbid conditions and/or a member of an at-risk group.     Unable to reach pt  

## 2020-02-20 LAB — CULTURE, BLOOD (ROUTINE X 2)
Culture: NO GROWTH
Culture: NO GROWTH
Special Requests: ADEQUATE
Special Requests: ADEQUATE

## 2020-10-25 IMAGING — DX DG CHEST 1V PORT
1 series · 1 of 1 positions shown · non-contrast
Comparison: No prior.

CLINICAL DATA: Shortness of breath and weakness.  COVID positive.

EXAM:
PORTABLE CHEST 1 VIEW

[chest ap]
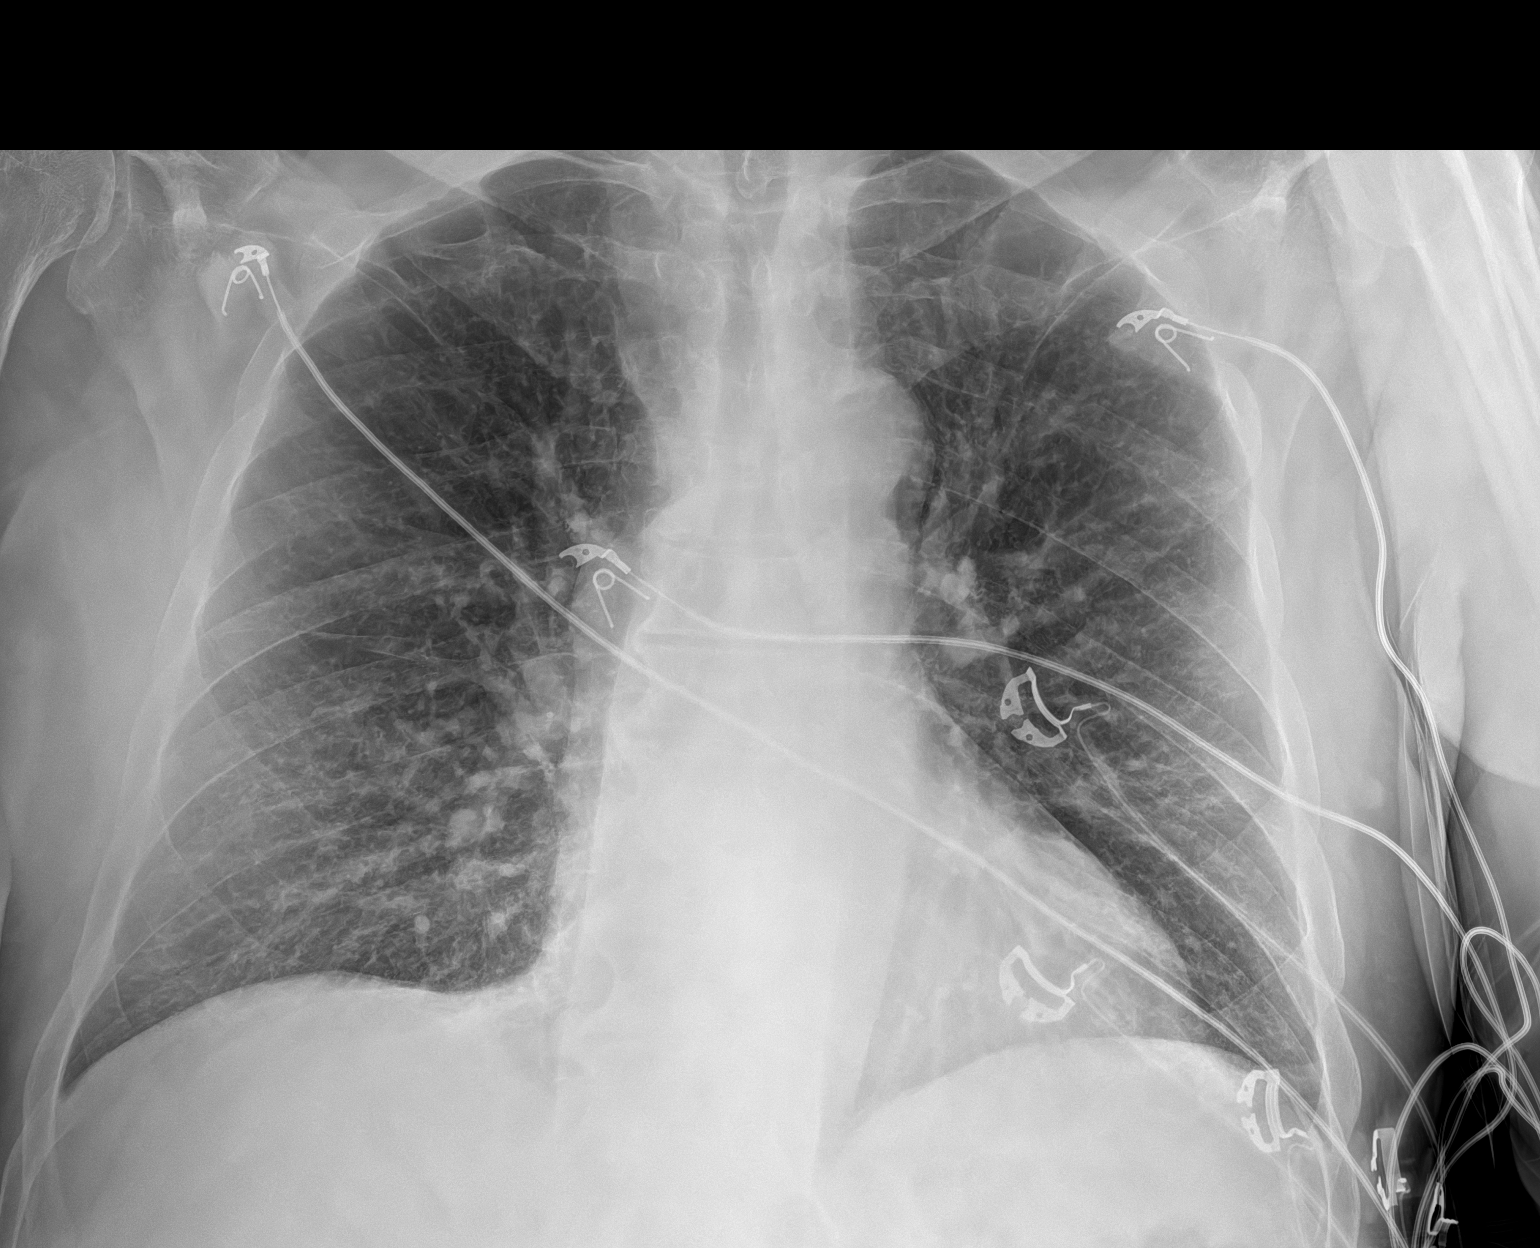

[1 of 1 positions shown; findings below may reference images not displayed]

FINDINGS: Mild upper mediastinal prominence, most likely secondary to AP
projection and prominent great vessels. Follow-up PA lateral chest
x-ray suggested heart size normal. Diffuse bilateral interstitial
prominence consistent with pneumonitis in this known COVID positive
patient. No pleural effusion or pneumothorax. No acute bony
abnormality.
IMPRESSION: 1. Diffuse bilateral interstitial prominence noted consistent with
pneumonitis in this known COVID positive patient.

2. Mild prominence of the upper mediastinum most likely secondary to
AP projection and prominent great vessels.

## 2022-01-28 DIAGNOSIS — H26492 Other secondary cataract, left eye: Secondary | ICD-10-CM | POA: Insufficient documentation

## 2022-01-28 DIAGNOSIS — Z961 Presence of intraocular lens: Secondary | ICD-10-CM | POA: Insufficient documentation

## 2022-01-28 DIAGNOSIS — H2511 Age-related nuclear cataract, right eye: Secondary | ICD-10-CM | POA: Insufficient documentation

## 2022-03-23 DIAGNOSIS — H02403 Unspecified ptosis of bilateral eyelids: Secondary | ICD-10-CM | POA: Insufficient documentation

## 2022-04-10 ENCOUNTER — Other Ambulatory Visit: Payer: Self-pay

## 2022-04-10 ENCOUNTER — Encounter (HOSPITAL_COMMUNITY): Payer: Self-pay | Admitting: Emergency Medicine

## 2022-04-10 ENCOUNTER — Emergency Department (HOSPITAL_COMMUNITY)
Admission: EM | Admit: 2022-04-10 | Discharge: 2022-04-11 | Disposition: A | Discharge status: Home / Self Care | Payer: Medicare HMO | Attending: Student | Admitting: Student

## 2022-04-10 DIAGNOSIS — R4182 Altered mental status, unspecified: Secondary | ICD-10-CM | POA: Diagnosis present

## 2022-04-10 DIAGNOSIS — R404 Transient alteration of awareness: Secondary | ICD-10-CM | POA: Insufficient documentation

## 2022-04-10 DIAGNOSIS — I10 Essential (primary) hypertension: Secondary | ICD-10-CM | POA: Diagnosis not present

## 2022-04-10 DIAGNOSIS — Z87891 Personal history of nicotine dependence: Secondary | ICD-10-CM | POA: Insufficient documentation

## 2022-04-10 DIAGNOSIS — J449 Chronic obstructive pulmonary disease, unspecified: Secondary | ICD-10-CM | POA: Insufficient documentation

## 2022-04-10 HISTORY — DX: Unspecified cataract: H26.9

## 2022-04-10 NOTE — ED Triage Notes (Signed)
Daughter states pt called her around 9pm tonight stating that he was "seeing red everywhere" and that he thought someone had sprayed his house red. Daughter went to get pt and take hime to her house and stated he made strange comments about the driveway "being all broken up" when it was the usual gravel on the road. Daughter concerned pt having a stroke. Pt alert and oriented, ambulatory, no speech deficits. LKW 12 noon. Pt does c/o "little" headache as well as chest pain.

## 2022-04-11 ENCOUNTER — Encounter (HOSPITAL_COMMUNITY): Payer: Self-pay | Admitting: Emergency Medicine

## 2022-04-11 ENCOUNTER — Emergency Department (HOSPITAL_COMMUNITY): Payer: Medicare HMO

## 2022-04-11 LAB — URINALYSIS, ROUTINE W REFLEX MICROSCOPIC
Bacteria, UA: NONE SEEN
Bilirubin Urine: NEGATIVE
Glucose, UA: NEGATIVE mg/dL
Ketones, ur: NEGATIVE mg/dL
Leukocytes,Ua: NEGATIVE
Nitrite: NEGATIVE
Protein, ur: NEGATIVE mg/dL
Specific Gravity, Urine: 1.009 (ref 1.005–1.030)
pH: 6 (ref 5.0–8.0)

## 2022-04-11 LAB — CBC
HCT: 43.1 % (ref 39.0–52.0)
Hemoglobin: 14.4 g/dL (ref 13.0–17.0)
MCH: 30 pg (ref 26.0–34.0)
MCHC: 33.4 g/dL (ref 30.0–36.0)
MCV: 89.8 fL (ref 80.0–100.0)
Platelets: 203 10*3/uL (ref 150–400)
RBC: 4.8 MIL/uL (ref 4.22–5.81)
RDW: 13.3 % (ref 11.5–15.5)
WBC: 6.7 10*3/uL (ref 4.0–10.5)
nRBC: 0 % (ref 0.0–0.2)

## 2022-04-11 LAB — COMPREHENSIVE METABOLIC PANEL
ALT: 14 U/L (ref 0–44)
AST: 20 U/L (ref 15–41)
Albumin: 4 g/dL (ref 3.5–5.0)
Alkaline Phosphatase: 71 U/L (ref 38–126)
Anion gap: 7 (ref 5–15)
BUN: 14 mg/dL (ref 8–23)
CO2: 26 mmol/L (ref 22–32)
Calcium: 8.8 mg/dL — ABNORMAL LOW (ref 8.9–10.3)
Chloride: 104 mmol/L (ref 98–111)
Creatinine, Ser: 1.13 mg/dL (ref 0.61–1.24)
GFR, Estimated: >60 mL/min (ref >60)
Glucose, Bld: 105 mg/dL — ABNORMAL HIGH (ref 70–99)
Potassium: 3.6 mmol/L (ref 3.5–5.1)
Sodium: 137 mmol/L (ref 135–145)
Total Bilirubin: 0.8 mg/dL (ref 0.3–1.2)
Total Protein: 7.7 g/dL (ref 6.5–8.1)

## 2022-04-11 LAB — TROPONIN I (HIGH SENSITIVITY): Troponin I (High Sensitivity): 13 ng/L (ref <18)

## 2022-04-11 NOTE — ED Provider Notes (Signed)
[facility] EMERGENCY DEPARTMENT Provider Note  CSN: [id number] Arrival date & time: 04/10/22 2343  Chief Complaint(s) Altered Mental Status  HPI Michael Bailey is a 86 y.o. male with PMH COPD, HTN, bilateral cataracts who presents emergency department for evaluation of altered mental status and visual hallucinations.  History obtained from patient's family who states that earlier today, they received a call from the patient in which he was seen red streaks on the walls of his home and on his body.  On arrival, family did not see any of this and patient was intermittently confused.  On arrival to the emergency department, patient has returned to normal mental status baseline and these red streaks have dissipated.  He denies any chest pain, shortness of breath, abdominal pain, nausea, vomiting or other systemic symptoms.  Denies illicit substance use or alcohol use.   Past Medical History Past Medical History:  Diagnosis Date   Bilateral cataracts    COPD (chronic obstructive pulmonary disease) (HCC)    Hypertension    There are no problems to display for this patient.  Home Medication(s) Prior to Admission medications   Not on File                                                                                                                                    Past Surgical History History reviewed. No pertinent surgical history. Family History Family History  Problem Relation Age of Onset   Healthy Mother    Healthy Father     Social History Social History   Tobacco Use   Smoking status: Former   Smokeless tobacco: Never  Substance Use Topics   Alcohol use: No   Allergies Patient has no known allergies.  Review of Systems Review of Systems  Psychiatric/Behavioral:  Positive for confusion and hallucinations.     Physical Exam Vital Signs  I have reviewed the triage vital signs BP (!) 199/112   Pulse 62   Temp 97.8 F (36.6 C) (Oral)   Resp 19   Ht 6\' 2"  (1.88  m)   Wt 77 kg   SpO2 99%   BMI 21.80 kg/m   Physical Exam Constitutional:      General: He is not in acute distress.    Appearance: Normal appearance.  HENT:     Head: Normocephalic and atraumatic.     Nose: No congestion or rhinorrhea.  Eyes:     General:        Right eye: No discharge.        Left eye: No discharge.     Extraocular Movements: Extraocular movements intact.     Pupils: Pupils are equal, round, and reactive to light.  Cardiovascular:     Rate and Rhythm: Normal rate and regular rhythm.     Heart sounds: No murmur heard. Pulmonary:     Effort: No respiratory distress.     Breath sounds: No wheezing  or rales.  Abdominal:     General: There is no distension.     Tenderness: There is no abdominal tenderness.  Musculoskeletal:        General: Normal range of motion.     Cervical back: Normal range of motion.  Skin:    General: Skin is warm and dry.  Neurological:     General: No focal deficit present.     Mental Status: He is alert.     ED Results and Treatments Labs (all labs ordered are listed, but only abnormal results are displayed) Labs Reviewed  COMPREHENSIVE METABOLIC PANEL - Abnormal; Notable for the following components:      Result Value   Glucose, Bld 105 (*)    Calcium 8.8 (*)    All other components within normal limits  URINALYSIS, ROUTINE W REFLEX MICROSCOPIC - Abnormal; Notable for the following components:   Hgb urine dipstick SMALL (*)    All other components within normal limits  CBC  TROPONIN I (HIGH SENSITIVITY)                                                                                                                          Radiology CT Head Wo Contrast  Result Date: 04/11/2022 CLINICAL DATA:  Delirium. Daughter states pt called her around 9pm tonight stating that he was "seeing red everywhere" and that he thought someone had sprayed his house red EXAM: CT HEAD WITHOUT CONTRAST TECHNIQUE: Contiguous axial images were  obtained from the base of the skull through the vertex without intravenous contrast. RADIATION DOSE REDUCTION: This exam was performed according to the departmental dose-optimization program which includes automated exposure control, adjustment of the mA and/or kV according to patient size and/or use of iterative reconstruction technique. COMPARISON:  None Available. BRAIN: BRAIN Cerebral ventricle sizes are concordant with the degree of cerebral volume loss. Patchy and confluent areas of decreased attenuation are noted throughout the deep and periventricular white matter of the cerebral hemispheres bilaterally, compatible with chronic microvascular ischemic disease. No evidence of large-territorial acute infarction. No parenchymal hemorrhage. No mass lesion. No extra-axial collection. No mass effect or midline shift. No hydrocephalus. Basilar cisterns are patent. Vascular: No hyperdense vessel. Skull: No acute fracture or focal lesion. Sinuses/Orbits: Paranasal sinuses and mastoid air cells are clear. Left lens replacement. Otherwise the orbits are unremarkable. Other: None. IMPRESSION: No acute intracranial abnormality in a patient with age-appropriate it diffuse volume loss and chronic microvascular ischemic changes. Electronically Signed   By: Tish FredericksonMorgane  Naveau M.D.   On: 04/11/2022 01:05   DG Chest Portable 1 View  Result Date: 04/11/2022 CLINICAL DATA:  CP EXAM: PORTABLE CHEST 1 VIEW COMPARISON:  Chest x-ray 01/20/2020 FINDINGS: The heart and mediastinal contours are unchanged. Similar-appearing chronic lateral airspace opacity that may be related to overlying soft tissues. No new focal consolidation. Chronic coarsened markings with no overt pulmonary edema. Trace fluid within the right minor fissure. No significant pleural effusion. No pneumothorax. No acute  osseous abnormality. IMPRESSION: 1. No active disease. 2. Similar-appearing chronic lateral airspace opacity that may be related to overlying soft  tissues. Consider CT for further evaluation/exclude underlying mass lesion. 3. Aortic Atherosclerosis (ICD10-I70.0) and Emphysema (ICD10-J43.9). Electronically Signed   By: Iven Finn M.D.   On: 04/11/2022 00:41    Pertinent labs & imaging results that were available during my care of the patient were reviewed by me and considered in my medical decision making (see MDM for details).  Medications Ordered in ED Medications - No data to display                                                                                                                                   Procedures Procedures  (including critical care time)  Medical Decision Making / ED Course   This patient presents to the ED for concern of altered mental status, this involves an extensive number of treatment options, and is a complaint that carries with it a high risk of complications and morbidity.  The differential diagnosis includes delirium, vascular dementia, Lewy body dementia, electrolyte abnormality, UTI, PRES  MDM: Patient seen emergency room for evaluation of altered mental status and visual hallucinations.  Physical exam is unremarkable.  Laboratory evaluation also unremarkable including a negative urinalysis.  CT head and chest x-ray also unremarkable.  Patient's baseline blood pressure usually involves systolics greater than 350 and he was persistently hypertensive here but given that the patient is on no medication for this and current lecher suggests aggressive ER blood pressure control leads to worse outcomes, we will not attempt to aggressively lower his blood pressure in the emergency department today.  I have overall low suspicion for posterior reversible encephalopathy syndrome in the setting of a negative CT and no symptoms in the emergency department today.  At this time, the patient's symptoms are idiopathic and it is unclear if these will recur.  He currently does not meet inpatient criteria for  admission and family was given strict return precautions regarding return of symptoms.  An amatory referral to [facility] neurology was sent for Lewy body dementia work-up.  Patient was instructed to call his primary care physician and asked for an expedited follow-up to discuss blood pressure control.  Patient then discharged   Additional history obtained: -Additional history obtained from multiple family members -External records from outside source obtained and reviewed including: Chart review including previous notes, labs, imaging, consultation notes   Lab Tests: -I ordered, reviewed, and interpreted labs.   The pertinent results include:   Labs Reviewed  COMPREHENSIVE METABOLIC PANEL - Abnormal; Notable for the following components:      Result Value   Glucose, Bld 105 (*)    Calcium 8.8 (*)    All other components within normal limits  URINALYSIS, ROUTINE W REFLEX MICROSCOPIC - Abnormal; Notable for the following components:   Hgb urine dipstick SMALL (*)    All  other components within normal limits  CBC  TROPONIN I (HIGH SENSITIVITY)      EKG   EKG Interpretation  Date/Time:  Friday April 10 2022 23:57:50 EDT Ventricular Rate:  78 PR Interval:  272 QRS Duration: 94 QT Interval:  405 QTC Calculation: 462 R Axis:   18 Text Interpretation: Sinus rhythm Prolonged PR interval Confirmed by Otis Burress ([id number]) on 04/11/2022 4:56:15 AM         Imaging Studies ordered: I ordered imaging studies including chest x-ray, CT head I independently visualized and interpreted imaging. I agree with the radiologist interpretation   Medicines ordered and prescription drug management: No orders of the defined types were placed in this encounter.   -I have reviewed the patients home medicines and have made adjustments as needed  Critical interventions none    Cardiac Monitoring: The patient was maintained on a cardiac monitor.  I personally viewed and interpreted the  cardiac monitored which showed an underlying rhythm of: NSR  Social Determinants of Health:  Factors impacting patients care include: none   Reevaluation: After the interventions noted above, I reevaluated the patient and found that they have :improved  Co morbidities that complicate the patient evaluation  Past Medical History:  Diagnosis Date   Bilateral cataracts    COPD (chronic obstructive pulmonary disease) (HCC)    Hypertension       Dispostion: I considered admission for this patient, but he currently does not meet inpatient criteria for admission he is safe for discharge with outpatient follow-up     Final Clinical Impression(s) / ED Diagnoses Final diagnoses:  Transient alteration of awareness  Hypertension, unspecified type     @PCDICTATION @    Tyjon Bowen, , MD 04/11/22 [phone]

## 2022-04-14 ENCOUNTER — Ambulatory Visit: Payer: Medicare HMO

## 2022-04-14 ENCOUNTER — Telehealth: Payer: Self-pay | Admitting: Emergency Medicine

## 2022-04-14 NOTE — Telephone Encounter (Signed)
Reviewed UC appt list and noticed pt made an appointment for MRI order to be placed and complete blood work up. Consulted providers and was told to contact pt and discuss need to followup with pcp. Contacted pt and spoke with POA. POA states pt has pcp appt but needed MRI and bloodwork prior to followup with specialist. POA aware that per UC provider this is greater than UC scope. POA verbalized understanding. Appt cancelled.

## 2022-04-17 DIAGNOSIS — I700 Atherosclerosis of aorta: Secondary | ICD-10-CM | POA: Insufficient documentation

## 2022-04-17 DIAGNOSIS — R9389 Abnormal findings on diagnostic imaging of other specified body structures: Secondary | ICD-10-CM | POA: Insufficient documentation

## 2022-04-17 DIAGNOSIS — I10 Essential (primary) hypertension: Secondary | ICD-10-CM | POA: Insufficient documentation

## 2022-04-28 ENCOUNTER — Ambulatory Visit: Payer: Medicare HMO | Admitting: Neurology

## 2022-04-28 ENCOUNTER — Encounter: Payer: Self-pay | Admitting: Neurology

## 2022-04-28 VITALS — BP 144/92 | HR 69 | Ht 74.0 in | Wt 221.0 lb

## 2022-04-28 DIAGNOSIS — R443 Hallucinations, unspecified: Secondary | ICD-10-CM

## 2022-04-28 DIAGNOSIS — I679 Cerebrovascular disease, unspecified: Secondary | ICD-10-CM | POA: Diagnosis not present

## 2022-04-28 DIAGNOSIS — F03B0 Unspecified dementia, moderate, without behavioral disturbance, psychotic disturbance, mood disturbance, and anxiety: Secondary | ICD-10-CM | POA: Diagnosis not present

## 2022-04-28 NOTE — Progress Notes (Signed)
Chief Complaint  Patient presents with   New Patient (Initial Visit)    Rm 13. Accompanied by daughters. NP internal referral for altered mental status and visual hallucinations. C/o choking while eating, falls, shuffling of feet, headache, memory concern.      ASSESSMENT AND PLAN  Michael Bailey is a 86 y.o. 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, with vascular component,  MRI of the brain in September 2023 from [facility] as described extensive small vessel disease, generalized atrophy, hippocampal atrophy more on the right side  Discussed with patient and his daughters potential evaluation such as echocardiogram, ultrasound of carotid artery, they decided to hold off, also would not start Aricept, and Namenda at this point,  Emphasized the importance of personal safety, increase water intake,  Improve his vision and hearing, will likely improve his quality of life  Vitamin B12 deficiency,  On B12 IM supplement  Return to clinic for new issue DIAGNOSTIC DATA (LABS, IMAGING, TESTING) - I reviewed patient records, labs, notes, testing and imaging myself where available. MRI brain on SEpt 28 [id number] at [facility] There is extensive chronic white matter disease and mild diffuse cerebral atrophy.   There is right hippocampal atrophy.    Laboratory evaluation from primary care on September 22, magnesium 2.3 vitamin D 28, B12 240, folic acid 10.7, PSA was elevated 6.4, normal TSH, free T4,) 1.03 hemoglobin of 14.9, mild elevation of total cholesterol 210, LDL 136,  MEDICAL HISTORY:  Michael Bailey is a 86 year old male, accompanied by his 2 daughters, seen in request by his primary care nurse practitioner Roe Rutherford, for evaluation of memory loss, worsening functional status, initial evaluation April 28, 2022,  I reviewed and summarized the referring note. PMHX. 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  He has significant hearing loss, difficulty carry on conversation, also has significant decreased vision due to bilateral cataract, severe ptosis, has to take bilateral upper eyelid,  He used to work at [employer] and as a Visual merchandiser, since his wife passed away in 2018-11-22, he lives alone, daughter check on him also, he was noted to have slow onset memory loss since November 22, 2018, forget conversation, also complicated by his poor hearing and visual,  He had 1 episode of visual hallucination few days ago, he came back home at a evening time, find his wall was painted red, the red paint was everywhere, including his stove, when he wiped it, it would go to his hand and arm, this really alarmed him, he has to call his daughter, stated that his daughters overnight  He also have slow worsening gait abnormality occasionally bowel bladder incontinence  Personally reviewed CT head without contrast September 2023, no acute abnormality, generalized atrophy, diffuse periventricular chronic small vessel disease   PHYSICAL EXAM:   Vitals:   04/28/22 0918  BP: (!) 144/92  Pulse: 69  Weight: 221 lb (100.2 kg)  Height: 6\' 2"  (1.88 m)   Not recorded     Body mass index is 28.37 kg/m.  PHYSICAL EXAMNIATION:  Gen: NAD, conversant, well nourised, well groomed                     Cardiovascular: Regular rate rhythm, no peripheral edema, warm, nontender. Eyes: Conjunctivae clear without exudates or hemorrhage Neck: Supple, no carotid bruits. Pulmonary: Clear to auscultation bilaterally   NEUROLOGICAL EXAM:  MENTAL STATUS: Speech/cognition: Very hard of  hearing,    04/28/2022   10:00 AM  Montreal Cognitive Assessment   Visuospatial/ Executive (0/5) 4  Naming (0/3) 3  Attention: Read list of digits (0/2) 2  Attention: Read list of letters (0/1) 1  Attention: Serial 7 subtraction starting at 100 (0/3) 1  Language: Repeat phrase (0/2) 2  Language : Fluency (0/1) 0  Abstraction (0/2) 0   Delayed Recall (0/5) 0  Orientation (0/6) 5  Total 18    CRANIAL NERVES: CN II: Visual fields are full to confrontation. Pupils are round equal and briskly reactive to light. CN III, IV, VI: extraocular movement are normal.  Static bilateral severe ptosis, was taped. CN V: Facial sensation is intact to light touch CN VII: Face is symmetric with normal eye closure  CN VIII: Severe bilateral hearing loss, hard to carry on conversation CN IX, X: Phonation is normal. 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  REFLEXES: Reflexes are hypoactive and symmetric at the biceps, triceps, knees, and ankles. Plantar responses are flexor.  SENSORY: Intact to light touch, pinprick and vibratory sensation  COORDINATION: There is no trunk or limb dysmetria noted.  GAIT/STANCE: Need push-up to get up from seated position, cautious,  REVIEW OF SYSTEMS:  Full 14 system review of systems performed and notable only for as above All other review of systems were negative.   ALLERGIES: No Known Allergies  HOME MEDICATIONS: Current Outpatient Medications  Medication Sig Dispense Refill   cyanocobalamin (VITAMIN B12) 1000 MCG/ML injection Inject 1,000 mcg into the skin every 30 (thirty) days.     losartan-hydrochlorothiazide (HYZAAR) 50-12.5 MG tablet Take 1 tablet by mouth daily.     No current facility-administered medications for this visit.    PAST MEDICAL HISTORY: Past Medical History:  Diagnosis Date   Bilateral cataracts    COPD (chronic obstructive pulmonary disease) ([place])    Hypertension     PAST SURGICAL HISTORY: Past Surgical History:  Procedure Laterality Date   CATARACT EXTRACTION     HAND SURGERY  1968   work related accident     FAMILY HISTORY: Family History  Problem Relation Age of Onset   Cancer Mother 15    SOCIAL HISTORY: Social History   Socioeconomic History   Marital status: Widowed    Spouse name: Not on file   Number of children: 6   Years of education: Not on file   Highest education level: Not on file  Occupational History   Not on file  Tobacco Use   Smoking status: Former    Types: Cigarettes    Quit date: 37    Years since quitting: 41.7   Smokeless tobacco: Never  Substance and Sexual Activity   Alcohol use: No   Drug use: Never   Sexual activity: Not on file  Other Topics Concern   Not on file  Social History Narrative   Drinks 2 cups of caffeine qd.   Social Determinants of Health   Financial Resource Strain: Not on file  Food Insecurity: Not on file  Transportation Needs: Not on file  Physical Activity: Not on file  Stress: Not on file  Social Connections: Not on file  Intimate Partner Violence: Not on file      Marcial Pacas, M.D. Ph.D.  [facility] [address]  [place], [place] [address] Ph: [phone] Fax: [phone]  CC:  Kommor, Wyn Forster, MD [address] [place],  [place] [address]  Roe Rutherford, NP    Total time spent reviewing the chart, obtaining history, examined patient, ordering tests, documentation, consultations and family, care coordination was 60 minutes

## 2022-05-25 ENCOUNTER — Encounter: Payer: Self-pay | Admitting: Adult Health Nurse Practitioner

## 2022-08-06 ENCOUNTER — Ambulatory Visit (INDEPENDENT_AMBULATORY_CARE_PROVIDER_SITE_OTHER): Payer: Medicare HMO | Admitting: Urology

## 2022-08-06 ENCOUNTER — Encounter: Payer: Self-pay | Admitting: Urology

## 2022-08-06 VITALS — BP 146/84 | HR 76

## 2022-08-06 DIAGNOSIS — N401 Enlarged prostate with lower urinary tract symptoms: Secondary | ICD-10-CM

## 2022-08-06 DIAGNOSIS — R351 Nocturia: Secondary | ICD-10-CM

## 2022-08-06 DIAGNOSIS — R339 Retention of urine, unspecified: Secondary | ICD-10-CM

## 2022-08-06 DIAGNOSIS — N138 Other obstructive and reflux uropathy: Secondary | ICD-10-CM

## 2022-08-06 DIAGNOSIS — R972 Elevated prostate specific antigen [PSA]: Secondary | ICD-10-CM

## 2022-08-06 LAB — URINALYSIS, ROUTINE W REFLEX MICROSCOPIC
Bilirubin, UA: NEGATIVE
Glucose, UA: NEGATIVE
Ketones, UA: NEGATIVE
Leukocytes,UA: NEGATIVE
Nitrite, UA: NEGATIVE
Protein,UA: NEGATIVE
RBC, UA: NEGATIVE
Specific Gravity, UA: <1.005 — ABNORMAL LOW (ref 1.005–1.030)
Urobilinogen, Ur: 0.2 mg/dL (ref 0.2–1.0)
pH, UA: 5.5 (ref 5.0–7.5)

## 2022-08-06 LAB — BLADDER SCAN AMB NON-IMAGING: Scan Result: 266

## 2022-08-06 NOTE — Progress Notes (Signed)
Subjective: 1. Elevated PSA   2. BPH with urinary obstruction   3. Nocturia   4. Incomplete bladder emptying      Consult requested by Roe Rutherford NP.  Michael Bailey is an 87 yo male who had a PSA done in 9/23 as part of an evaluation for mental status changes.  It was found to be elevated at 6.4.   He is currently on tamsulosin for LUTS but it is not clear who prescribed it.  His IPSS is 32 with nocturia x 4.  He has no hematuria or dysuria.  He has some incontinence.   He has had no GU surgery.  He had an enterobacter UTI in 2018 that required hospitalization.   He has no history of stones.  ROS:  Review of Systems  Constitutional:  Positive for chills, fever (99 last night) and malaise/fatigue.  HENT:  Positive for congestion.   Eyes:  Positive for blurred vision.  Respiratory:  Positive for cough and shortness of breath.   Musculoskeletal:  Positive for back pain and joint pain.  Neurological:  Positive for weakness and headaches.  Endo/Heme/Allergies:  Bruises/bleeds easily.  Psychiatric/Behavioral:  Positive for memory loss. The patient is nervous/anxious.   All other systems reviewed and are negative.   No Known Allergies  Past Medical History:  Diagnosis Date   Bilateral cataracts    COPD (chronic obstructive pulmonary disease) ([facility])    Hypertension     Past Surgical History:  Procedure Laterality Date   CATARACT EXTRACTION     HAND SURGERY  1968   work related accident    Social History   Socioeconomic History   Marital status: Widowed    Spouse name: Not on file   Number of children: 6   Years of education: Not on file   Highest education level: Not on file  Occupational History   Not on file  Tobacco Use   Smoking status: Former    Types: Cigarettes    Quit date: 68    Years since quitting: 42.0   Smokeless tobacco: Never  Substance and Sexual Activity   Alcohol use: No   Drug use: Never   Sexual activity: Not on file  Other Topics Concern   Not  on file  Social History Narrative   Drinks 2 cups of caffeine qd.   Social Determinants of Health   Financial Resource Strain: Not on file  Food Insecurity: Not on file  Transportation Needs: Not on file  Physical Activity: Not on file  Stress: Not on file  Social Connections: Not on file  Intimate Partner Violence: Not on file    Family History  Problem Relation Age of Onset   Cancer Mother 59    Anti-infectives: Anti-infectives (From admission, onward)    None       Current Outpatient Medications  Medication Sig Dispense Refill   ALPRAZolam (XANAX) 0.25 MG tablet Take 0.25 mg by mouth 2 (two) times daily as needed for anxiety.     cyanocobalamin (VITAMIN B12) 1000 MCG/ML injection Inject 1,000 mcg into the skin every 30 (thirty) days.     losartan (COZAAR) 25 MG tablet Take 25 mg by mouth 2 (two) times daily.     tamsulosin (FLOMAX) 0.4 MG CAPS capsule Take 0.4 mg by mouth every evening.     No current facility-administered medications for this visit.     Objective: Vital signs in last 24 hours: BP (!) 146/84   Pulse 76   Intake/Output from previous  day: No intake/output data recorded. Intake/Output this shift: @IOTHISSHIFT @   Physical Exam Vitals reviewed.  Constitutional:      Appearance: Normal appearance.  HENT:     Head: Normocephalic and atraumatic.  Cardiovascular:     Rate and Rhythm: Normal rate and regular rhythm.     Heart sounds: Normal heart sounds.  Pulmonary:     Effort: Pulmonary effort is normal. No respiratory distress.     Breath sounds: Wheezing (on the right) present.  Abdominal:     General: Abdomen is flat.     Palpations: Abdomen is soft.     Hernia: A hernia (small umbilical) is present.  Genitourinary:    Comments: Normal phallus with adequate meatus. Scrotum, testes and epididymis normal AP without lesions. NST without mass. Prostate 2+ benign. SV normal.  Musculoskeletal:        General: No swelling. Normal range  of motion.  Skin:    General: Skin is warm and dry.  Neurological:     General: No focal deficit present.     Mental Status: He is alert.  Psychiatric:        Mood and Affect: Mood normal.        Behavior: Behavior normal.     Lab Results:  Results for orders placed or performed in visit on 08/06/22 (from the past 24 hour(s))  Urinalysis, Routine w reflex microscopic     Status: Abnormal   Collection Time: 08/06/22  1:02 PM  Result Value Ref Range   Specific Gravity, UA <1.005 (L) 1.005 - 1.030   pH, UA 5.5 5.0 - 7.5   Color, UA Yellow Yellow   Appearance Ur Clear Clear   Leukocytes,UA Negative Negative   Protein,UA Negative Negative/Trace   Glucose, UA Negative Negative   Ketones, UA Negative Negative   RBC, UA Negative Negative   Bilirubin, UA Negative Negative   Urobilinogen, Ur 0.2 0.2 - 1.0 mg/dL   Nitrite, UA Negative Negative   Microscopic Examination Comment    Narrative   Performed at:  [facility] [address] [place], [place]  [id number] Lab Director: Mina Marble MT, Phone:  [phone]  PSA, total and free     Status: Abnormal   Collection Time: 08/06/22  1:52 PM  Result Value Ref Range   Prostate Specific Ag, Serum 9.9 (H) 0.0 - 4.0 ng/mL   PSA, Free 2.31 N/A ng/mL   PSA, Free Pct 23.3 %   Narrative   Performed at:  [address] [place], [place]  [id number] Lab Director: Rush Farmer MD, Phone:  [phone]    BMET No results for input(s): "NA", "K", "CL", "CO2", "GLUCOSE", "BUN", "CREATININE", "CALCIUM" in the last 72 hours. PT/INR No results for input(s): "LABPROT", "INR" in the last 72 hours. ABG No results for input(s): "PHART", "HCO3" in the last 72 hours.  Invalid input(s): "PCO2", "PO2"  Studies/Results: No results found. I have reviewed records from his PCP and his prior hospital notes.  Assessment/Plan: Elevated PSA.   His PSA is reasonable for his age and his exam is benign but with his  worsening LUTS I will repeat the level.   BPH with LUTS.  I will have him double the tamsulosin and return in the near future for voiding studies and possible cystoscopy.   No orders of the defined types were placed in this encounter.    Orders Placed This Encounter  Procedures   Urinalysis, Routine w reflex microscopic  PSA, total and free   BLADDER SCAN AMB NON-IMAGING     Return for 1-2 wks for flowrate, PVR and possible cystoscopy.  .    CC: Pablo Lawrence NP.      Irine Seal 08/07/2022

## 2022-08-06 NOTE — Progress Notes (Signed)
post void residual=266

## 2022-08-07 ENCOUNTER — Telehealth: Payer: Self-pay

## 2022-08-07 LAB — PSA, TOTAL AND FREE
PSA, Free Pct: 23.3 %
PSA, Free: 2.31 ng/mL
Prostate Specific Ag, Serum: 9.9 ng/mL — ABNORMAL HIGH (ref 0.0–4.0)

## 2022-08-07 NOTE — Telephone Encounter (Signed)
Left detail voiced message on patient home phone.

## 2022-08-07 NOTE — Telephone Encounter (Signed)
-----  Message from Irine Seal, MD sent at 08/07/2022  9:57 AM EST ----- His PSA is up further to 9.9.   He has f/u with me on 1/18 and I will discuss it with him and his daughter further then.  ----- Message ----- From: Sherrilyn Rist, CMA Sent: 08/07/2022   7:59 AM EST To: Irine Seal, MD  Please review

## 2022-08-13 ENCOUNTER — Encounter: Payer: Self-pay | Admitting: Urology

## 2022-08-13 ENCOUNTER — Ambulatory Visit: Payer: Medicare HMO | Admitting: Urology

## 2022-08-13 VITALS — BP 178/101 | HR 76 | Ht 74.0 in | Wt 221.0 lb

## 2022-08-13 DIAGNOSIS — N138 Other obstructive and reflux uropathy: Secondary | ICD-10-CM

## 2022-08-13 DIAGNOSIS — N401 Enlarged prostate with lower urinary tract symptoms: Secondary | ICD-10-CM

## 2022-08-13 DIAGNOSIS — R351 Nocturia: Secondary | ICD-10-CM | POA: Diagnosis not present

## 2022-08-13 DIAGNOSIS — R972 Elevated prostate specific antigen [PSA]: Secondary | ICD-10-CM

## 2022-08-13 DIAGNOSIS — R339 Retention of urine, unspecified: Secondary | ICD-10-CM

## 2022-08-13 MED ORDER — TAMSULOSIN HCL 0.4 MG PO CAPS: 0.4000 mg | ORAL_CAPSULE | Freq: Two times a day (BID) | ORAL | 3 refills | Status: DC

## 2022-08-13 NOTE — Progress Notes (Signed)
Subjective: 1. BPH with urinary obstruction   2. Elevated PSA   3. Incomplete bladder emptying   4. Nocturia      08/13/22: Hashim returns today for voiding studies.  His repeat PSA was up to 9.9 with a 31% f/t ratio.  He doubled the tamsulosin and is doing better.  He has a good stream with an PF of 24ml/sec on FR today.  He has persistent nocturia 4-5x.  He has less day time frequency and less urgency.   His PVR today is 16ml.  He had a URI at the time of the blood draw.   08/06/22: Aasir is an 87 yo male who had a PSA done in 9/23 as part of an evaluation for mental status changes.  It was found to be elevated at 6.4.   He is currently on tamsulosin for LUTS but it is not clear who prescribed it.  His IPSS is 32 with nocturia x 4.  He has no hematuria or dysuria.  He has some incontinence.   He has had no GU surgery.  He had an enterobacter UTI in 2018 that required hospitalization.   He has no history of stones.  ROS:  Review of Systems  All other systems reviewed and are negative.   No Known Allergies  Past Medical History:  Diagnosis Date   Bilateral cataracts    COPD (chronic obstructive pulmonary disease) ([place])    Hypertension     Past Surgical History:  Procedure Laterality Date   CATARACT EXTRACTION     HAND SURGERY  1968   work related accident    Social History   Socioeconomic History   Marital status: Widowed    Spouse name: Not on file   Number of children: 6   Years of education: Not on file   Highest education level: Not on file  Occupational History   Not on file  Tobacco Use   Smoking status: Former    Types: Cigarettes    Quit date: 31    Years since quitting: 42.0   Smokeless tobacco: Never  Substance and Sexual Activity   Alcohol use: No   Drug use: Never   Sexual activity: Not on file  Other Topics Concern   Not on file  Social History Narrative   Drinks 2 cups of caffeine qd.   Social Determinants of Health   Financial Resource Strain:  Not on file  Food Insecurity: Not on file  Transportation Needs: Not on file  Physical Activity: Not on file  Stress: Not on file  Social Connections: Not on file  Intimate Partner Violence: Not on file    Family History  Problem Relation Age of Onset   Cancer Mother 37    Anti-infectives: Anti-infectives (From admission, onward)    None       Current Outpatient Medications  Medication Sig Dispense Refill   ALPRAZolam (XANAX) 0.25 MG tablet Take 0.25 mg by mouth 2 (two) times daily as needed for anxiety.     cyanocobalamin (VITAMIN B12) 1000 MCG/ML injection Inject 1,000 mcg into the skin every 30 (thirty) days.     losartan (COZAAR) 25 MG tablet Take 25 mg by mouth 2 (two) times daily.     tamsulosin (FLOMAX) 0.4 MG CAPS capsule Take 1 capsule (0.4 mg total) by mouth 2 (two) times daily. 200 capsule 3   No current facility-administered medications for this visit.     Objective: Vital signs in last 24 hours: BP (!) 178/101  Pulse 76   Ht 6\' 2"  (1.88 m)   Wt 221 lb (100.2 kg)   BMI 28.37 kg/m   Intake/Output from previous day: No intake/output data recorded. Intake/Output this shift: @IOTHISSHIFT @   Physical Exam Vitals reviewed.  Constitutional:      Appearance: Normal appearance.  Neurological:     Mental Status: He is alert.     Lab Results:  No results found for this or any previous visit (from the past 24 hour(s)).   BMET No results for input(s): "NA", "K", "CL", "CO2", "GLUCOSE", "BUN", "CREATININE", "CALCIUM" in the last 72 hours. PT/INR No results for input(s): "LABPROT", "INR" in the last 72 hours. ABG No results for input(s): "PHART", "HCO3" in the last 72 hours.  Invalid input(s): "PCO2", "PO2"  Studies/Results: No results found.  Flowrate had a PF of 67ml/sec and a MF of 76ml/sec with a volume of 268ml  PVR is 145ml.   Assessment/Plan: Elevated PSA.   His PSA  is up to 9.9 from 6.4 in 3 months but he had a URI at the time of the  blood draw.  I will repeat the PSA in about 6 weeks.   BPH with LUTS.  He is voiding better and content with his symptoms on tamsulosin 0.8mg .  Cysto not needed today.   Meds ordered this encounter  Medications   tamsulosin (FLOMAX) 0.4 MG CAPS capsule    Sig: Take 1 capsule (0.4 mg total) by mouth 2 (two) times daily.    Dispense:  200 capsule    Refill:  3     Orders Placed This Encounter  Procedures   Urinalysis, Routine w reflex microscopic   PSA, total and free    Standing Status:   Future    Standing Expiration Date:   12/12/2022   PR COMPLEX UROFLOMETRY   BLADDER SCAN AMB NON-IMAGING     Return in about 6 weeks (around 09/24/2022).    CC: Pablo Lawrence NP.      Irine Seal 08/14/2022

## 2022-08-13 NOTE — Progress Notes (Signed)
Uroflow  Peak Flow: 35ml Average Flow: 50ml Voided Volume: 217ml Voiding Time: 27sec Flow Time: 27sec Time to Peak Flow: 7sec  PVR Volume: 152ml

## 2022-08-14 ENCOUNTER — Encounter: Payer: Self-pay | Admitting: Urology

## 2022-09-25 ENCOUNTER — Other Ambulatory Visit: Payer: Medicare HMO

## 2024-01-30 ENCOUNTER — Emergency Department (HOSPITAL_COMMUNITY)

## 2024-01-30 ENCOUNTER — Other Ambulatory Visit: Payer: Self-pay

## 2024-01-30 ENCOUNTER — Emergency Department (HOSPITAL_COMMUNITY)
Admission: EM | Admit: 2024-01-30 | Discharge: 2024-01-31 | Disposition: A | Discharge status: Home / Self Care | Attending: Emergency Medicine | Admitting: Emergency Medicine

## 2024-01-30 DIAGNOSIS — I10 Essential (primary) hypertension: Secondary | ICD-10-CM | POA: Insufficient documentation

## 2024-01-30 DIAGNOSIS — Z79899 Other long term (current) drug therapy: Secondary | ICD-10-CM | POA: Diagnosis not present

## 2024-01-30 DIAGNOSIS — Y92009 Unspecified place in unspecified non-institutional (private) residence as the place of occurrence of the external cause: Secondary | ICD-10-CM | POA: Insufficient documentation

## 2024-01-30 DIAGNOSIS — R0789 Other chest pain: Secondary | ICD-10-CM | POA: Diagnosis present

## 2024-01-30 DIAGNOSIS — W06XXXA Fall from bed, initial encounter: Secondary | ICD-10-CM | POA: Insufficient documentation

## 2024-01-30 DIAGNOSIS — F0390 Unspecified dementia without behavioral disturbance: Secondary | ICD-10-CM | POA: Diagnosis not present

## 2024-01-30 DIAGNOSIS — J449 Chronic obstructive pulmonary disease, unspecified: Secondary | ICD-10-CM | POA: Diagnosis not present

## 2024-01-30 DIAGNOSIS — Z87891 Personal history of nicotine dependence: Secondary | ICD-10-CM | POA: Insufficient documentation

## 2024-01-30 LAB — BASIC METABOLIC PANEL WITH GFR
Anion gap: 13 (ref 5–15)
BUN: 14 mg/dL (ref 8–23)
CO2: 22 mmol/L (ref 22–32)
Calcium: 8.9 mg/dL (ref 8.9–10.3)
Chloride: 103 mmol/L (ref 98–111)
Creatinine, Ser: 1.16 mg/dL (ref 0.61–1.24)
GFR, Estimated: >60 mL/min (ref >60)
Glucose, Bld: 120 mg/dL — ABNORMAL HIGH (ref 70–99)
Potassium: 4.1 mmol/L (ref 3.5–5.1)
Sodium: 138 mmol/L (ref 135–145)

## 2024-01-30 LAB — CBC WITH DIFFERENTIAL/PLATELET
Abs Immature Granulocytes: 0.02 K/uL (ref 0.00–0.07)
Basophils Absolute: 0 K/uL (ref 0.0–0.1)
Basophils Relative: 0 %
Eosinophils Absolute: 0 K/uL (ref 0.0–0.5)
Eosinophils Relative: 0 %
HCT: 48.4 % (ref 39.0–52.0)
Hemoglobin: 16.4 g/dL (ref 13.0–17.0)
Immature Granulocytes: 0 %
Lymphocytes Relative: 8 %
Lymphs Abs: 0.7 K/uL (ref 0.7–4.0)
MCH: 30.3 pg (ref 26.0–34.0)
MCHC: 33.9 g/dL (ref 30.0–36.0)
MCV: 89.5 fL (ref 80.0–100.0)
Monocytes Absolute: 1 K/uL (ref 0.1–1.0)
Monocytes Relative: 10 %
Neutro Abs: 8 K/uL — ABNORMAL HIGH (ref 1.7–7.7)
Neutrophils Relative %: 82 %
Platelets: 173 K/uL (ref 150–400)
RBC: 5.41 MIL/uL (ref 4.22–5.81)
RDW: 13.7 % (ref 11.5–15.5)
WBC: 9.8 K/uL (ref 4.0–10.5)
nRBC: 0 % (ref 0.0–0.2)

## 2024-01-30 LAB — TROPONIN I (HIGH SENSITIVITY)
Troponin I (High Sensitivity): 19 ng/L — ABNORMAL HIGH (ref <18)
Troponin I (High Sensitivity): 20 ng/L — ABNORMAL HIGH (ref <18)

## 2024-01-30 LAB — CK: Total CK: 85 U/L (ref 49–397)

## 2024-01-30 NOTE — ED Triage Notes (Signed)
 Pt bib [facility] from home c/o a unwitnessed fall, per EMS he was found sitting against the wall on their arrival only complains of chest pressure.   Hx of dementia, non compliant with all medications for the last 4 months per EMS

## 2024-01-30 NOTE — ED Notes (Addendum)
 Pt A&Oriented to self. (Dementia at baseline). Unable to get direct answers from pt. When asked if he was in pain he started to explain that he needed a carriage and carrots. Despite the answer pt does not appear to this nurse to be in discomfort he is lying in bed smiling and hands behind head.   Wound near right elbow that is red not actively bleeding. Lower extremities appear swollen. When touching pt he does not grimace or pull away in pain. Breathing WNL on RA. Pt is hypertensive EDP made aware.

## 2024-01-30 NOTE — Discharge Instructions (Signed)
 We evaluated Michael Bailey for his fall from bed.  His testing in the emergency department was reassuring.  We did not see any signs of a head injury, neck injury or chest injury.  His laboratory testing was also reassuring.  We feel that it is safe to go home at this time.  Please have him follow-up with his primary doctor.  If he has any new or worsening symptoms, please bring her back to the emergency department.

## 2024-01-30 NOTE — ED Provider Notes (Signed)
 [facility] EMERGENCY DEPARTMENT AT [facility] Provider Note  CSN: [id number] Arrival date & time: 01/30/24 1954  Chief Complaint(s) No chief complaint on file.  HPI Michael Bailey is a 88 y.o. male history of COPD, hypertension presenting to the emergency department with apparent fall.  Patient apparently had unwitnessed fall at home, daughter found patient sitting against the wall.  Apparently patient complained of some chest pressure to paramedics.  Currently, the patient denies any pain.  He is very demented but pleasant.  History otherwise limited due to dementia   Past Medical History Past Medical History:  Diagnosis Date   Bilateral cataracts    COPD (chronic obstructive pulmonary disease) (HCC)    Hypertension    Patient Active Problem List   Diagnosis Date Noted   Moderate dementia without behavioral disturbance, psychotic disturbance, mood disturbance, or anxiety (HCC) 04/28/2022   Hallucination 04/28/2022   Cerebral vascular disease 04/28/2022   Abnormal chest x-ray 04/17/2022   Essential hypertension 04/17/2022   Hardening of the aorta (main artery of the heart) (HCC) 04/17/2022   Ptosis of both upper eyelids 03/23/2022   Nuclear sclerosis, right 01/28/2022   Posterior capsular opacification, left 01/28/2022   Pseudophakia of left eye 01/28/2022   Home Medication(s) Prior to Admission medications   Medication Sig Start Date End Date Taking? Authorizing Provider  ALPRAZolam (XANAX) 0.25 MG tablet Take 0.25 mg by mouth 2 (two) times daily as needed for anxiety.    [provider]  cyanocobalamin (VITAMIN B12) 1000 MCG/ML injection Inject 1,000 mcg into the skin every 30 (thirty) days. 04/20/22   [provider]  losartan (COZAAR) 25 MG tablet Take 25 mg by mouth 2 (two) times daily.    [provider]  tamsulosin  (FLOMAX ) 0.4 MG CAPS capsule Take 1 capsule (0.4 mg total) by mouth 2 (two) times daily. 08/13/22   Watt Rush, MD                                                                                                                                     Past Surgical History Past Surgical History:  Procedure Laterality Date   CATARACT EXTRACTION     HAND SURGERY  1968   work related accident   Family History Family History  Problem Relation Age of Onset   Cancer Mother 2    Social History Social History   Tobacco Use   Smoking status: Former    Current packs/day: 0.00    Types: Cigarettes    Quit date: 1982    Years since quitting: 43.5   Smokeless tobacco: Never  Substance Use Topics   Alcohol use: No   Drug use: Never   Allergies Patient has no known allergies.  Review of Systems Review of Systems  All other systems reviewed and are negative.   Physical Exam Vital Signs  I have reviewed the triage vital signs BP (!) 159/106  Pulse 90   Temp 99 F (37.2 C) (Oral)   Resp 17   SpO2 96%  Physical Exam Vitals and nursing note reviewed.  Constitutional:      General: He is not in acute distress.    Appearance: Normal appearance.  HENT:     Mouth/Throat:     Mouth: Mucous membranes are moist.  Eyes:     Conjunctiva/sclera: Conjunctivae normal.  Cardiovascular:     Rate and Rhythm: Normal rate and regular rhythm.  Pulmonary:     Effort: Pulmonary effort is normal. No respiratory distress.     Breath sounds: Normal breath sounds.  Abdominal:     General: Abdomen is flat.     Palpations: Abdomen is soft.     Tenderness: There is no abdominal tenderness.  Musculoskeletal:     Right lower leg: No edema.     Left lower leg: No edema.     Comments: No midline C, T, L-spine tenderness.  No chest wall tenderness or crepitus.  Full painless range of motion at the bilateral upper extremities including the shoulders, elbows, wrists, hand and fingers, and in the bilateral lower extremities including the hips, knees, ankle, toes.  No focal bony tenderness, injury or deformity.  Skin:    General:  Skin is warm and dry.     Capillary Refill: Capillary refill takes less than 2 seconds.  Neurological:     Mental Status: He is alert. Mental status is at baseline.     Comments: ANO x 0, says he does not know his name but has fluent speech moves all 4 extremities.  Psychiatric:        Mood and Affect: Mood normal.        Behavior: Behavior normal.     ED Results and Treatments Labs (all labs ordered are listed, but only abnormal results are displayed) Labs Reviewed  BASIC METABOLIC PANEL WITH GFR - Abnormal; Notable for the following components:      Result Value   Glucose, Bld 120 (*)    All other components within normal limits  CBC WITH DIFFERENTIAL/PLATELET - Abnormal; Notable for the following components:   Neutro Abs 8.0 (*)    All other components within normal limits  TROPONIN I (HIGH SENSITIVITY) - Abnormal; Notable for the following components:   Troponin I (High Sensitivity) 19 (*)    All other components within normal limits  CK  TROPONIN I (HIGH SENSITIVITY)                                                                                                                          Radiology CT Head Wo Contrast Result Date: 01/30/2024 CLINICAL DATA:  Headache, new onset (Age >= 51y); Neck trauma (Age >= 65y). Unwitnessed fall, per EMS he was found sitting against the wall on their arrival only complains of chest pressure. Hx of dementia, non compliant with all medications for the last 4 months per EMS EXAM: CT HEAD  WITHOUT CONTRAST CT CERVICAL SPINE WITHOUT CONTRAST TECHNIQUE: Multidetector CT imaging of the head and cervical spine was performed following the standard protocol without intravenous contrast. Multiplanar CT image reconstructions of the cervical spine were also generated. RADIATION DOSE REDUCTION: This exam was performed according to the departmental dose-optimization program which includes automated exposure control, adjustment of the mA and/or kV according to  patient size and/or use of iterative reconstruction technique. COMPARISON:  CT head 04/08/2022 FINDINGS: CT HEAD FINDINGS Brain: No evidence of large-territorial acute infarction. No parenchymal hemorrhage. No mass lesion. No extra-axial collection. No mass effect or midline shift. No hydrocephalus. Basilar cisterns are patent. Vascular: No hyperdense vessel. Atherosclerotic calcifications are present within the cavernous internal carotid arteries. Skull: No acute fracture or focal lesion. Sinuses/Orbits: Paranasal sinuses and mastoid air cells are clear. Left lens replacement. Otherwise the orbits are unremarkable. Other: None. CT CERVICAL SPINE FINDINGS Alignment: Grade 1 anterolisthesis of C3 on C4 of C5 on C6. No associated severe osseous no femoral or central canal stenosis. Skull base and vertebrae: Multilevel moderate degenerative changes of the spine. No severe osseous neural foraminal or central canal stenosis. No acute fracture. No aggressive appearing focal osseous lesion or focal pathologic process. Soft tissues and spinal canal: No prevertebral fluid or swelling. No visible canal hematoma. Upper chest: Unremarkable. Other: None. IMPRESSION: 1. No acute intracranial abnormality. 2. No acute displaced fracture or traumatic listhesis of the cervical spine. Electronically Signed   By: Morgane  Naveau M.D.   On: 01/30/2024 21:05   CT Cervical Spine Wo Contrast Result Date: 01/30/2024 CLINICAL DATA:  Headache, new onset (Age >= 51y); Neck trauma (Age >= 65y). Unwitnessed fall, per EMS he was found sitting against the wall on their arrival only complains of chest pressure. Hx of dementia, non compliant with all medications for the last 4 months per EMS EXAM: CT HEAD WITHOUT CONTRAST CT CERVICAL SPINE WITHOUT CONTRAST TECHNIQUE: Multidetector CT imaging of the head and cervical spine was performed following the standard protocol without intravenous contrast. Multiplanar CT image reconstructions of the  cervical spine were also generated. RADIATION DOSE REDUCTION: This exam was performed according to the departmental dose-optimization program which includes automated exposure control, adjustment of the mA and/or kV according to patient size and/or use of iterative reconstruction technique. COMPARISON:  CT head 04/08/2022 FINDINGS: CT HEAD FINDINGS Brain: No evidence of large-territorial acute infarction. No parenchymal hemorrhage. No mass lesion. No extra-axial collection. No mass effect or midline shift. No hydrocephalus. Basilar cisterns are patent. Vascular: No hyperdense vessel. Atherosclerotic calcifications are present within the cavernous internal carotid arteries. Skull: No acute fracture or focal lesion. Sinuses/Orbits: Paranasal sinuses and mastoid air cells are clear. Left lens replacement. Otherwise the orbits are unremarkable. Other: None. CT CERVICAL SPINE FINDINGS Alignment: Grade 1 anterolisthesis of C3 on C4 of C5 on C6. No associated severe osseous no femoral or central canal stenosis. Skull base and vertebrae: Multilevel moderate degenerative changes of the spine. No severe osseous neural foraminal or central canal stenosis. No acute fracture. No aggressive appearing focal osseous lesion or focal pathologic process. Soft tissues and spinal canal: No prevertebral fluid or swelling. No visible canal hematoma. Upper chest: Unremarkable. Other: None. IMPRESSION: 1. No acute intracranial abnormality. 2. No acute displaced fracture or traumatic listhesis of the cervical spine. Electronically Signed   By: Morgane  Naveau M.D.   On: 01/30/2024 21:05   DG Chest Portable 1 View Result Date: 01/30/2024 CLINICAL DATA:  Chest pain.  Unwitnessed fall.  Chest pressure.  EXAM: PORTABLE CHEST 1 VIEW COMPARISON:  04/11/2022 FINDINGS: Heart size and pulmonary vascularity are normal for technique. Peribronchial thickening with linear opacities in the perihilar region likely representing chronic bronchitis. Similar  appearance to prior study. No developing consolidation or edema. No pleural effusion or pneumothorax. Mediastinal contours appear intact. Calcification of the aorta. Degenerative changes in the spine. IMPRESSION: Chronic bronchitic changes in the lungs.  No focal consolidation. Electronically Signed   By: Elsie Gravely M.D.   On: 01/30/2024 20:57    Pertinent labs & imaging results that were available during my care of the patient were reviewed by me and considered in my medical decision making (see MDM for details).  Medications Ordered in ED Medications - No data to display                                                                                                                                   Procedures Procedures  (including critical care time)  Medical Decision Making / ED Course   MDM:  88 year old presenting to the emergency department with unwitnessed fall.    Patient well-appearing, moving all 4 extremities.  No obvious signs of traumatic injury.  There is superficial elbow wound but moving his arms around without difficulty.  No tenderness.  Low concern for dangerous consequences of fall.  Do not think patient will need admission for obtain CT head and CT cervical spine which are negative.  Chest x-ray clear.  Patient apparently complained of chest pain to paramedics troponin was checked, minimally elevated, here denies any pain.  Will check delta.  If this is reassuring anticipate discharge back to home.  ECG does appear to show new afib/flutter but unknown onset. Not tachycardic. Given advanced dementia and frequent falls dont think eliquis would be beneficial       Additional history obtained: -Additional history obtained from ems -External records from outside source obtained and reviewed including: Chart review including previous notes, labs, imaging, consultation notes including prior notes    Lab Tests: -I ordered, reviewed, and interpreted labs.   The  pertinent results include:   Labs Reviewed  BASIC METABOLIC PANEL WITH GFR - Abnormal; Notable for the following components:      Result Value   Glucose, Bld 120 (*)    All other components within normal limits  CBC WITH DIFFERENTIAL/PLATELET - Abnormal; Notable for the following components:   Neutro Abs 8.0 (*)    All other components within normal limits  TROPONIN I (HIGH SENSITIVITY) - Abnormal; Notable for the following components:   Troponin I (High Sensitivity) 19 (*)    All other components within normal limits  CK  TROPONIN I (HIGH SENSITIVITY)    Notable for borderline elevated troponin  EKG   EKG Interpretation Date/Time:  Sunday January 30 2024 20:07:06 EDT Ventricular Rate:  84 PR Interval:    QRS Duration:  94 QT Interval:  338 QTC Calculation:  400 R Axis:   47  Text Interpretation: Atrial fibrillation Borderline repolarization abnormality Confirmed by Francesca Fallow ([id number]) on 01/30/2024 11:10:09 PM         Imaging Studies ordered: I ordered imaging studies including CXR, CT head , CT cervical spine On my interpretation imaging demonstrates no acute process I independently visualized and interpreted imaging. I agree with the radiologist interpretation   Medicines ordered and prescription drug management: No orders of the defined types were placed in this encounter.   -I have reviewed the patients home medicines and have made adjustments as needed     Cardiac Monitoring: The patient was maintained on a cardiac monitor.  I personally viewed and interpreted the cardiac monitored which showed an underlying rhythm of: afib  Reevaluation: After the interventions noted above, I reevaluated the patient and found that their symptoms have improved  Co morbidities that complicate the patient evaluation  Past Medical History:  Diagnosis Date   Bilateral cataracts    COPD (chronic obstructive pulmonary disease) (HCC)    Hypertension        Dispostion: Disposition decision including need for hospitalization was considered, and patient {[id number]::discharged from emergency department.}    Final Clinical Impression(s) / ED Diagnoses Final diagnoses:  None     This chart was dictated using voice recognition software.  Despite best efforts to proofread,  errors can occur which can change the documentation meaning.

## 2024-01-31 NOTE — ED Notes (Signed)
 Pt ambulated from ED room 6 to ED room 2 with no assistance. Family stated, is how he normally walks. No concerns were expressed or seen when walking.

## 2024-03-01 ENCOUNTER — Encounter (HOSPITAL_BASED_OUTPATIENT_CLINIC_OR_DEPARTMENT_OTHER): Payer: Self-pay | Admitting: Emergency Medicine

## 2024-03-01 ENCOUNTER — Emergency Department (HOSPITAL_BASED_OUTPATIENT_CLINIC_OR_DEPARTMENT_OTHER): Admitting: Radiology

## 2024-03-01 ENCOUNTER — Emergency Department (HOSPITAL_BASED_OUTPATIENT_CLINIC_OR_DEPARTMENT_OTHER)

## 2024-03-01 ENCOUNTER — Observation Stay (HOSPITAL_BASED_OUTPATIENT_CLINIC_OR_DEPARTMENT_OTHER)
Admission: EM | Admit: 2024-03-01 | Discharge: 2024-03-07 | Disposition: A | Discharge status: Skilled Nursing Facility | Attending: Emergency Medicine | Admitting: Emergency Medicine

## 2024-03-01 DIAGNOSIS — Z7401 Bed confinement status: Secondary | ICD-10-CM | POA: Diagnosis not present

## 2024-03-01 DIAGNOSIS — I160 Hypertensive urgency: Principal | ICD-10-CM | POA: Diagnosis present

## 2024-03-01 DIAGNOSIS — F03918 Unspecified dementia, unspecified severity, with other behavioral disturbance: Secondary | ICD-10-CM | POA: Diagnosis not present

## 2024-03-01 DIAGNOSIS — R9431 Abnormal electrocardiogram [ECG] [EKG]: Secondary | ICD-10-CM | POA: Diagnosis not present

## 2024-03-01 DIAGNOSIS — N400 Enlarged prostate without lower urinary tract symptoms: Secondary | ICD-10-CM | POA: Diagnosis not present

## 2024-03-01 DIAGNOSIS — M25562 Pain in left knee: Secondary | ICD-10-CM | POA: Diagnosis not present

## 2024-03-01 DIAGNOSIS — N182 Chronic kidney disease, stage 2 (mild): Secondary | ICD-10-CM | POA: Diagnosis not present

## 2024-03-01 DIAGNOSIS — R42 Dizziness and giddiness: Principal | ICD-10-CM | POA: Insufficient documentation

## 2024-03-01 DIAGNOSIS — I129 Hypertensive chronic kidney disease with stage 1 through stage 4 chronic kidney disease, or unspecified chronic kidney disease: Secondary | ICD-10-CM | POA: Diagnosis not present

## 2024-03-01 DIAGNOSIS — Z87891 Personal history of nicotine dependence: Secondary | ICD-10-CM | POA: Diagnosis not present

## 2024-03-01 DIAGNOSIS — E785 Hyperlipidemia, unspecified: Secondary | ICD-10-CM | POA: Insufficient documentation

## 2024-03-01 DIAGNOSIS — R7989 Other specified abnormal findings of blood chemistry: Secondary | ICD-10-CM

## 2024-03-01 DIAGNOSIS — F4489 Other dissociative and conversion disorders: Secondary | ICD-10-CM | POA: Diagnosis not present

## 2024-03-01 DIAGNOSIS — J4489 Other specified chronic obstructive pulmonary disease: Secondary | ICD-10-CM | POA: Insufficient documentation

## 2024-03-01 DIAGNOSIS — R1313 Dysphagia, pharyngeal phase: Secondary | ICD-10-CM | POA: Diagnosis not present

## 2024-03-01 DIAGNOSIS — E538 Deficiency of other specified B group vitamins: Secondary | ICD-10-CM | POA: Diagnosis not present

## 2024-03-01 DIAGNOSIS — I10 Essential (primary) hypertension: Secondary | ICD-10-CM

## 2024-03-01 DIAGNOSIS — Z79899 Other long term (current) drug therapy: Secondary | ICD-10-CM | POA: Diagnosis not present

## 2024-03-01 DIAGNOSIS — F0390 Unspecified dementia without behavioral disturbance: Secondary | ICD-10-CM | POA: Diagnosis present

## 2024-03-01 DIAGNOSIS — E8809 Other disorders of plasma-protein metabolism, not elsewhere classified: Secondary | ICD-10-CM | POA: Insufficient documentation

## 2024-03-01 DIAGNOSIS — Z91199 Patient's noncompliance with other medical treatment and regimen due to unspecified reason: Secondary | ICD-10-CM | POA: Insufficient documentation

## 2024-03-01 DIAGNOSIS — R296 Repeated falls: Secondary | ICD-10-CM | POA: Diagnosis not present

## 2024-03-01 DIAGNOSIS — I4811 Longstanding persistent atrial fibrillation: Secondary | ICD-10-CM | POA: Diagnosis not present

## 2024-03-01 LAB — COMPREHENSIVE METABOLIC PANEL WITH GFR
ALT: 8 U/L (ref 0–44)
AST: 17 U/L (ref 15–41)
Albumin: 4.1 g/dL (ref 3.5–5.0)
Alkaline Phosphatase: 76 U/L (ref 38–126)
Anion gap: 12 (ref 5–15)
BUN: 15 mg/dL (ref 8–23)
CO2: 26 mmol/L (ref 22–32)
Calcium: 9.6 mg/dL (ref 8.9–10.3)
Chloride: 103 mmol/L (ref 98–111)
Creatinine, Ser: 1.33 mg/dL — ABNORMAL HIGH (ref 0.61–1.24)
GFR, Estimated: 52 mL/min — ABNORMAL LOW (ref >60)
Glucose, Bld: 89 mg/dL (ref 70–99)
Potassium: 4 mmol/L (ref 3.5–5.1)
Sodium: 140 mmol/L (ref 135–145)
Total Bilirubin: 0.9 mg/dL (ref 0.0–1.2)
Total Protein: 7.2 g/dL (ref 6.5–8.1)

## 2024-03-01 LAB — RESP PANEL BY RT-PCR (RSV, FLU A&B, COVID)  RVPGX2
Influenza A by PCR: NEGATIVE
Influenza B by PCR: NEGATIVE
Resp Syncytial Virus by PCR: NEGATIVE
SARS Coronavirus 2 by RT PCR: NEGATIVE

## 2024-03-01 LAB — CBC WITH DIFFERENTIAL/PLATELET
Abs Immature Granulocytes: 0.02 K/uL (ref 0.00–0.07)
Basophils Absolute: 0 K/uL (ref 0.0–0.1)
Basophils Relative: 1 %
Eosinophils Absolute: 0.2 K/uL (ref 0.0–0.5)
Eosinophils Relative: 3 %
HCT: 47.7 % (ref 39.0–52.0)
Hemoglobin: 15.6 g/dL (ref 13.0–17.0)
Immature Granulocytes: 0 %
Lymphocytes Relative: 29 %
Lymphs Abs: 1.8 K/uL (ref 0.7–4.0)
MCH: 29.9 pg (ref 26.0–34.0)
MCHC: 32.7 g/dL (ref 30.0–36.0)
MCV: 91.4 fL (ref 80.0–100.0)
Monocytes Absolute: 0.8 K/uL (ref 0.1–1.0)
Monocytes Relative: 12 %
Neutro Abs: 3.5 K/uL (ref 1.7–7.7)
Neutrophils Relative %: 55 %
Platelets: 173 K/uL (ref 150–400)
RBC: 5.22 MIL/uL (ref 4.22–5.81)
RDW: 14 % (ref 11.5–15.5)
WBC: 6.2 K/uL (ref 4.0–10.5)
nRBC: 0 % (ref 0.0–0.2)

## 2024-03-01 LAB — URINALYSIS, ROUTINE W REFLEX MICROSCOPIC
Bilirubin Urine: NEGATIVE
Glucose, UA: NEGATIVE mg/dL
Hgb urine dipstick: NEGATIVE
Ketones, ur: NEGATIVE mg/dL
Leukocytes,Ua: NEGATIVE
Nitrite: NEGATIVE
Protein, ur: NEGATIVE mg/dL
Specific Gravity, Urine: <1.005 — ABNORMAL LOW (ref 1.005–1.030)
pH: 6 (ref 5.0–8.0)

## 2024-03-01 LAB — TROPONIN T, HIGH SENSITIVITY
Troponin T High Sensitivity: 66 ng/L — ABNORMAL HIGH (ref <19)
Troponin T High Sensitivity: 59 ng/L — ABNORMAL HIGH (ref <19)

## 2024-03-01 MED ORDER — ALPRAZOLAM 0.25 MG PO TABS
0.2500 mg | ORAL_TABLET | Freq: Two times a day (BID) | ORAL | Status: DC | PRN
  Administered 2024-03-01 – 2024-03-06 (×3): 0.25 mg via ORAL
  Filled 2024-03-01 (×2): qty 1

## 2024-03-01 MED ORDER — LOSARTAN POTASSIUM 25 MG PO TABS: 25.0000 mg | ORAL_TABLET | Freq: Two times a day (BID) | ORAL | Status: DC

## 2024-03-01 MED ORDER — HYDRALAZINE HCL 20 MG/ML IJ SOLN
10.0000 mg | Freq: Once | INTRAMUSCULAR | Status: AC
  Administered 2024-03-01: 10 mg via INTRAVENOUS
  Filled 2024-03-01: qty 1

## 2024-03-01 MED ORDER — POLYETHYLENE GLYCOL 3350 17 G PO PACK: 17.0000 g | PACK | Freq: Every day | ORAL | Status: DC | PRN

## 2024-03-01 MED ORDER — TAMSULOSIN HCL 0.4 MG PO CAPS: 0.4000 mg | ORAL_CAPSULE | Freq: Two times a day (BID) | ORAL | Status: DC

## 2024-03-01 MED ORDER — ACETAMINOPHEN 325 MG PO TABS
650.0000 mg | ORAL_TABLET | Freq: Once | ORAL | Status: AC
  Administered 2024-03-01: 650 mg via ORAL
  Filled 2024-03-01: qty 2

## 2024-03-01 MED ORDER — CLONIDINE HCL 0.1 MG PO TABS
0.1000 mg | ORAL_TABLET | Freq: Once | ORAL | Status: AC
  Administered 2024-03-01: 0.1 mg via ORAL
  Filled 2024-03-01: qty 1

## 2024-03-01 MED ORDER — ALBUTEROL SULFATE (2.5 MG/3ML) 0.083% IN NEBU: 2.5000 mg | INHALATION_SOLUTION | RESPIRATORY_TRACT | Status: DC | PRN

## 2024-03-01 MED ORDER — METOPROLOL SUCCINATE ER 25 MG PO TB24
25.0000 mg | ORAL_TABLET | Freq: Every day | ORAL | Status: DC
  Administered 2024-03-01: 25 mg via ORAL
  Filled 2024-03-01: qty 1

## 2024-03-01 MED ORDER — ACETAMINOPHEN 500 MG PO TABS
1000.0000 mg | ORAL_TABLET | Freq: Four times a day (QID) | ORAL | Status: DC | PRN
  Administered 2024-03-03 – 2024-03-05 (×2): 1000 mg via ORAL
  Filled 2024-03-01 (×3): qty 2

## 2024-03-01 MED ORDER — LOSARTAN POTASSIUM 50 MG PO TABS
50.0000 mg | ORAL_TABLET | Freq: Every day | ORAL | Status: DC
  Administered 2024-03-01: 50 mg via ORAL
  Filled 2024-03-01: qty 2

## 2024-03-01 MED ORDER — AMLODIPINE BESYLATE 10 MG PO TABS
10.0000 mg | ORAL_TABLET | Freq: Every day | ORAL | Status: DC
  Administered 2024-03-02 – 2024-03-07 (×8): 10 mg via ORAL
  Filled 2024-03-01 (×6): qty 1

## 2024-03-01 MED ORDER — HALOPERIDOL LACTATE 5 MG/ML IJ SOLN
1.0000 mg | Freq: Four times a day (QID) | INTRAMUSCULAR | Status: DC | PRN
  Administered 2024-03-02 – 2024-03-04 (×2): 1 mg via INTRAVENOUS
  Filled 2024-03-01 (×2): qty 1

## 2024-03-01 MED ORDER — MELATONIN 3 MG PO TABS
6.0000 mg | ORAL_TABLET | Freq: Every day | ORAL | Status: DC
  Administered 2024-03-01 – 2024-03-06 (×7): 6 mg via ORAL
  Filled 2024-03-01 (×6): qty 2

## 2024-03-01 MED ORDER — HYDRALAZINE HCL 20 MG/ML IJ SOLN: 10.0000 mg | INTRAMUSCULAR | Status: DC | PRN

## 2024-03-01 MED ORDER — QUETIAPINE FUMARATE 25 MG PO TABS
25.0000 mg | ORAL_TABLET | Freq: Every day | ORAL | Status: DC
  Administered 2024-03-01 – 2024-03-06 (×7): 25 mg via ORAL
  Filled 2024-03-01 (×6): qty 1

## 2024-03-01 MED ORDER — NITROGLYCERIN 0.4 MG SL SUBL: 0.4000 mg | SUBLINGUAL_TABLET | SUBLINGUAL | Status: DC | PRN

## 2024-03-01 MED ORDER — ONDANSETRON HCL 4 MG/2ML IJ SOLN: 4.0000 mg | Freq: Four times a day (QID) | INTRAMUSCULAR | Status: DC | PRN

## 2024-03-01 MED ORDER — CYANOCOBALAMIN 1000 MCG/ML IJ SOLN
1000.0000 ug | INTRAMUSCULAR | Status: DC
  Administered 2024-03-02: 1000 ug via SUBCUTANEOUS
  Filled 2024-03-01: qty 1

## 2024-03-01 MED ORDER — ENOXAPARIN SODIUM 40 MG/0.4ML IJ SOSY
40.0000 mg | PREFILLED_SYRINGE | INTRAMUSCULAR | Status: DC
  Administered 2024-03-01 – 2024-03-06 (×7): 40 mg via SUBCUTANEOUS
  Filled 2024-03-01 (×6): qty 0.4

## 2024-03-01 MED ORDER — TAMSULOSIN HCL 0.4 MG PO CAPS
0.4000 mg | ORAL_CAPSULE | Freq: Every day | ORAL | Status: DC
  Administered 2024-03-02 – 2024-03-06 (×6): 0.4 mg via ORAL
  Filled 2024-03-01 (×5): qty 1

## 2024-03-01 NOTE — ED Notes (Signed)
 Pt leaving ED w/ [facility]

## 2024-03-01 NOTE — ED Notes (Signed)
 Patient transported to X-ray

## 2024-03-01 NOTE — ED Provider Notes (Signed)
 [place] EMERGENCY DEPARTMENT AT [facility] Provider Note   CSN: [id number] Arrival date & time: 03/01/24  1224     Patient presents with: Dementia   Michael Bailey is a 88 y.o. male.   HPI      88yo male with history of hypertension, paroxysmal atrial fibrillation, hyperlipidemia, elevated PSA, B12 deficiency, anxiety, moderate dementia who presents with concern for worsening dementia, also reports chest pain and headaches.  Oct 2023 diagnosed, 6 months ago stopped all of his medications, for the past 2 months declining, multiple falls including one month ago.   Complains about headache and chest pains, bp high and not taking medications, cannot tell if short of breath.   Confused the difference between tv and reality. Today walked out of house 3 times in pouring rain, afraid he was going to leave, Friday walked a quarter mile down the road and said he was working on [employer] put something on stove, forgot it, smoke alarm went off couldn't hear it Mixes up days and nights, will sleep for 2 hours then wake up thinking it is morning Lives alone and concerned he is not safe. Have appointment with [facility]  No fevers  Watching him on cameras in the house Not taking any medications No cough, no known vomiting, diarrhea, constipation. Always has loose stools, does have chronic choking with eating Has been having hallucinations, a handful in past year   Past Medical History:  Diagnosis Date   Bilateral cataracts    COPD (chronic obstructive pulmonary disease) (HCC)    Hypertension      Prior to Admission medications   Not on File    Allergies: Patient has no known allergies.    Review of Systems  Updated Vital Signs BP (!) 181/128   Pulse 62   Temp 97.6 F (36.4 C) (Oral)   Resp 20   SpO2 97%   Physical Exam Vitals and nursing note reviewed.  Constitutional:      General: He is not in acute distress.    Appearance: Normal  appearance. He is well-developed. He is not ill-appearing or diaphoretic.  HENT:     Head: Normocephalic and atraumatic.  Eyes:     General: No visual field deficit.    Extraocular Movements: Extraocular movements intact.     Conjunctiva/sclera: Conjunctivae normal.     Pupils: Pupils are equal, round, and reactive to light.  Cardiovascular:     Rate and Rhythm: Normal rate and regular rhythm.     Pulses: Normal pulses.     Heart sounds: Normal heart sounds. No murmur heard.    No friction rub. No gallop.  Pulmonary:     Effort: Pulmonary effort is normal. No respiratory distress.     Breath sounds: Normal breath sounds. No wheezing or rales.  Abdominal:     General: There is no distension.     Palpations: Abdomen is soft.     Tenderness: There is no abdominal tenderness. There is no guarding.  Musculoskeletal:        General: No swelling or tenderness.     Cervical back: Normal range of motion.  Skin:    General: Skin is warm and dry.     Findings: No erythema or rash.  Neurological:     General: No focal deficit present.     Mental Status: He is alert.     GCS: GCS eye subscore is 4. GCS verbal subscore is 5. GCS motor subscore is 6.  Cranial Nerves: No cranial nerve deficit, dysarthria or facial asymmetry.     Sensory: No sensory deficit.     Motor: No weakness or tremor.     Coordination: Coordination normal. Finger-Nose-Finger Test normal.     Comments: Oriented to self     (all labs ordered are listed, but only abnormal results are displayed) Labs Reviewed  COMPREHENSIVE METABOLIC PANEL WITH GFR - Abnormal; Notable for the following components:      Result Value   Creatinine, Ser 1.33 (*)    GFR, Estimated 52 (*)    All other components within normal limits  URINALYSIS, ROUTINE W REFLEX MICROSCOPIC - Abnormal; Notable for the following components:   Color, Urine STRAW (*)    Specific Gravity, Urine <1.005 (*)    All other components within normal limits   TROPONIN T, HIGH SENSITIVITY - Abnormal; Notable for the following components:   Troponin T High Sensitivity 66 (*)    All other components within normal limits  TROPONIN T, HIGH SENSITIVITY - Abnormal; Notable for the following components:   Troponin T High Sensitivity 59 (*)    All other components within normal limits  RESP PANEL BY RT-PCR (RSV, FLU A&B, COVID)  [id number]  CBC WITH DIFFERENTIAL/PLATELET  BASIC METABOLIC PANEL WITH GFR  MAGNESIUM  PHOSPHORUS  TSH  VITAMIN B12    EKG: EKG Interpretation Date/Time:  Wednesday March 01 2024 13:31:20 EDT Ventricular Rate:  65 PR Interval:    QRS Duration:  96 QT Interval:  429 QTC Calculation: 447 R Axis:   92  Text Interpretation: Atrial fibrillation Right axis deviation Borderline repolarization abnormality No significant change since last tracing Confirmed by Dreama Longs ([id number]) on 03/01/2024 2:49:01 PM  Radiology: CT Head Wo Contrast Result Date: 03/01/2024 EXAM: CT HEAD WITHOUT CONTRAST 03/01/2024 02:55:51 PM TECHNIQUE: CT of the head was performed without the administration of intravenous contrast. Automated exposure control, iterative reconstruction, and/or weight based adjustment of the mA/kV was utilized to reduce the radiation dose to as low as reasonably achievable. COMPARISON: CT of the head dated 01/30/2024. CLINICAL HISTORY: Head trauma, minor (Age >= 65y). Family brings patient in d/t worsening dementia. Stating patient is unsafe at home and is starting to wander outside and leaving stuff on the stove. Family states need help placement. FINDINGS: BRAIN AND VENTRICLES: No acute hemorrhage. Gray-white differentiation is preserved. No hydrocephalus. No extra-axial collection. No mass effect or midline shift. Age-related atrophy and moderately advanced cerebral white matter disease. ORBITS: The patient is status post left lens replacement. SINUSES: No acute abnormality. SOFT TISSUES AND SKULL: No acute soft tissue abnormality.  No skull fracture. Moderate calcific atheromatous disease. IMPRESSION: 1. No acute intracranial abnormality. 2. Age-related atrophy and moderately advanced cerebral white matter disease. 3. Moderate calcific atheromatous disease. Electronically signed by: evalene coho 03/01/2024 03:12 PM EDT RP Workstation: [id number]   DG Chest 2 View Result Date: 03/01/2024 CLINICAL DATA:  Altered mental status. Worsening dementia. Ex-smoker. EXAM: CHEST - 2 VIEW COMPARISON:  01/30/2024 FINDINGS: Stable borderline enlarged cardiac silhouette. Interval mild linear atelectasis or scarring in the right mid lung zone. Stable mild diffuse peribronchial thickening and borderline hyperexpansion of the lungs. Thoracic spine degenerative changes. IMPRESSION: 1. Interval mild linear atelectasis or scarring in the right mid lung zone. 2. Stable mild changes of COPD and chronic bronchitis. Electronically Signed   By: Elspeth Bathe M.D.   On: 03/01/2024 14:23     Procedures   Medications Ordered in the ED  nitroGLYCERIN  (NITROSTAT ) SL  tablet 0.4 mg (has no administration in time range)  ALPRAZolam  (XANAX ) tablet 0.25 mg (0.25 mg Oral Given 03/01/24 1821)  cyanocobalamin  (VITAMIN B12) injection 1,000 mcg (has no administration in time range)  amLODipine  (NORVASC ) tablet 10 mg (has no administration in time range)  QUEtiapine  (SEROQUEL ) tablet 25 mg (25 mg Oral Given 03/01/24 2120)  haloperidol  lactate (HALDOL ) injection 1 mg (has no administration in time range)  tamsulosin  (FLOMAX ) capsule 0.4 mg (has no administration in time range)  hydrALAZINE  (APRESOLINE ) injection 10 mg (has no administration in time range)  metoprolol  succinate (TOPROL -XL) 24 hr tablet 25 mg (25 mg Oral Given 03/01/24 2121)  melatonin tablet 6 mg (6 mg Oral Given 03/01/24 2120)  enoxaparin  (LOVENOX ) injection 40 mg (40 mg Subcutaneous Given 03/01/24 2121)  acetaminophen  (TYLENOL ) tablet 1,000 mg (has no administration in time range)  albuterol  (PROVENTIL )  (2.5 MG/3ML) 0.083% nebulizer solution 2.5 mg (has no administration in time range)  ondansetron  (ZOFRAN ) injection 4 mg (has no administration in time range)  polyethylene glycol (MIRALAX  / GLYCOLAX ) packet 17 g (has no administration in time range)  acetaminophen  (TYLENOL ) tablet 650 mg (650 mg Oral Given 03/01/24 1603)  cloNIDine  (CATAPRES ) tablet 0.1 mg (0.1 mg Oral Given 03/01/24 1604)  hydrALAZINE  (APRESOLINE ) injection 10 mg (10 mg Intravenous Given 03/01/24 1740)                                     87yo male with history of hypertension, paroxysmal atrial fibrillation, hyperlipidemia, elevated PSA, B12 deficiency, anxiety, moderate dementia who presents with concern for worsening dementia, also reports chest pain and headaches.  EKG evaluated by me and shows atrial fibrillation without acute ST changes.  Chest x-ray completed and personally read interpreted by me shows interval atelectasis or scarring in the right midlung zone, stable changes of COPD or chronic bronchitis.  In the setting of worsening mental status, chest pain, and headache, labs include viral testing, evaluation of electrolytes, anemia, urinalysis to look for signs of infection, and troponin to look for ACS ordered. CT head done given history of headache, worsening mental status ordered and pending.   Labs completed and personally evaluated interpreted by me show no evidence of urinary tract infection, COVID, influenza, and RSV negative, CBC without anemia, no leukocytosis.  CMP with mild elevation in creatinine compared to prior.  Troponin is elevated to 66, with prior troponin I ranging from 13-23 over the last 4 years.   His history is limited by dementia, however he has had CP concerning for cardiac chest pain, also consider findings related to elevated blood pressures.   Signed out with CT head pending.     Final diagnoses:  Dizziness  Hypertensive urgency  Elevated troponin    ED Discharge Orders      None          Dreama Longs, MD 03/01/24 2158

## 2024-03-01 NOTE — H&P (Addendum)
 History and Physical    Shakeel Disney FMW:[id number] DOB: [birth year] DOA: 03/01/2024  PCP: Suanne Pfeiffer, NP   Patient coming from: Transfer from [facility] ED    Chief Complaint:  Chief Complaint  Patient presents with   Dementia    HPI:  Michael Bailey is a 88 y.o. male with hx of dementia, paroxysmal Afib, HTN, HLD, COPD by imaging, BPH, B12 deficiency, anxiety, who is transferred from [facility] ED for HTN urgency. Per outside record concern was for worsening dementia. Lived alone prior to this admission. Has especially worsened over the past 2 months. Daughter has installed cameras inside and outside of his house. Note of multiple falls, wandering behavior, hallucinations. Last fall was last night, no injuries that they have noted. He recently wandered 1/4 mile down the road and was found in a field. And today walked outside in the pouring rain multiple times but went back in, neighbor helped to redirect him. They live in [place], but daughter works in [place], has had to leave work multiple times this week to help due to these behavioral changes.   Otherwise per daughter his main complaints intermittently are HA and chest pain. He reports slight HA at time of interview but no chest pain. Daughter reports when comes over to her house sometimes he will place his hand on his chest. He has stopped taking all of his medications about 6 months ago, and over his lifetime has not been very keen on medication (not on until 88 y/o), saying per daughter that when it is his time to go, it is his time.     Review of Systems:  ROS complete and negative except as marked above   No Known Allergies  Prior to Admission medications   Medication Sig Start Date End Date Taking? Authorizing Provider  ALPRAZolam  (XANAX ) 0.25 MG tablet Take 0.25 mg by mouth 2 (two) times daily as needed for anxiety.    [provider]  cyanocobalamin  (VITAMIN B12) 1000 MCG/ML injection Inject 1,000 mcg into the skin  every 30 (thirty) days. 04/20/22   [provider]  losartan  (COZAAR ) 25 MG tablet Take 25 mg by mouth 2 (two) times daily.    [provider]  tamsulosin  (FLOMAX ) 0.4 MG CAPS capsule Take 1 capsule (0.4 mg total) by mouth 2 (two) times daily. 08/13/22   Watt Rush, MD    Past Medical History:  Diagnosis Date   Bilateral cataracts    COPD (chronic obstructive pulmonary disease) (HCC)    Hypertension     Past Surgical History:  Procedure Laterality Date   CATARACT EXTRACTION     HAND SURGERY  1968   work related accident     reports that he quit smoking about 43 years ago. His smoking use included cigarettes. He has never used smokeless tobacco. He reports that he does not drink alcohol and does not use drugs.  Family History  Problem Relation Age of Onset   Cancer Mother 62     Physical Exam: Vitals:   03/01/24 1708 03/01/24 1731 03/01/24 1740 03/01/24 1858  BP: (!) 201/152 (!) 170/102 (!) 170/102 (!) 141/105  Pulse: 62   69  Resp: 18   20  Temp:    97.6 F (36.4 C)  TempSrc:    Oral  SpO2: 99%   98%    Gen: Awake, alert, Elderly, but well nourished  HEENT: head atraumatic,  Neck: C spine no midline tenderness  CV: Regular, normal S1, S2, no murmurs  Resp:  Normal WOB, CTAB  Abd: Flat, normoactive, nontender MSK: Extremities all range without any discomfort and are atraumatic.  Skin: No rashes or lesions to exposed skin  Neuro: Alert and interactive, oriented to self only  Psych: calm, euthymic, appropriate    Data review:   Labs reviewed, notable for:   Cr 1.33 (b/l ~1.1)  HS trop 66 -> 59  Blood counts unremarkable   Micro:  Results for orders placed or performed during the hospital encounter of 03/01/24  Resp panel by RT-PCR (RSV, Flu A&B, Covid) Anterior Nasal Swab     Status: None   Collection Time: 03/01/24  1:41 PM   Specimen: Anterior Nasal Swab  Result Value Ref Range Status   SARS Coronavirus 2 by RT PCR NEGATIVE NEGATIVE  Final    Comment: (NOTE) SARS-CoV-2 target nucleic acids are NOT DETECTED.  The SARS-CoV-2 RNA is generally detectable in upper respiratory specimens during the acute phase of infection. The lowest concentration of SARS-CoV-2 viral copies this assay can detect is 138 copies/mL. A negative result does not preclude SARS-Cov-2 infection and should not be used as the sole basis for treatment or other patient management decisions. A negative result may occur with  improper specimen collection/handling, submission of specimen other than nasopharyngeal swab, presence of viral mutation(s) within the areas targeted by this assay, and inadequate number of viral copies(<138 copies/mL). A negative result must be combined with clinical observations, patient history, and epidemiological information. The expected result is Negative.  Fact Sheet for Patients:  BloggerCourse.com  Fact Sheet for Healthcare Providers:  SeriousBroker.it  This test is no t yet approved or cleared by the [place]  FDA and  has been authorized for detection and/or diagnosis of SARS-CoV-2 by FDA under an Emergency Use Authorization (EUA). This EUA will remain  in effect (meaning this test can be used) for the duration of the COVID-19 declaration under Section 564(b)(1) of the Act, 21 U.S.C.section 360bbb-3(b)(1), unless the authorization is terminated  or revoked sooner.       Influenza A by PCR NEGATIVE NEGATIVE Final   Influenza B by PCR NEGATIVE NEGATIVE Final    Comment: (NOTE) The Xpert Xpress SARS-CoV-2/FLU/RSV plus assay is intended as an aid in the diagnosis of influenza from Nasopharyngeal swab specimens and should not be used as a sole basis for treatment. Nasal washings and aspirates are unacceptable for Xpert Xpress SARS-CoV-2/FLU/RSV testing.  Fact Sheet for Patients: BloggerCourse.com  Fact Sheet for Healthcare  Providers: SeriousBroker.it  This test is not yet approved or cleared by the [place]  FDA and has been authorized for detection and/or diagnosis of SARS-CoV-2 by FDA under an Emergency Use Authorization (EUA). This EUA will remain in effect (meaning this test can be used) for the duration of the COVID-19 declaration under Section 564(b)(1) of the Act, 21 U.S.C. section 360bbb-3(b)(1), unless the authorization is terminated or revoked.     Resp Syncytial Virus by PCR NEGATIVE NEGATIVE Final    Comment: (NOTE) Fact Sheet for Patients: BloggerCourse.com  Fact Sheet for Healthcare Providers: SeriousBroker.it  This test is not yet approved or cleared by the [place]  FDA and has been authorized for detection and/or diagnosis of SARS-CoV-2 by FDA under an Emergency Use Authorization (EUA). This EUA will remain in effect (meaning this test can be used) for the duration of the COVID-19 declaration under Section 564(b)(1) of the Act, 21 U.S.C. section 360bbb-3(b)(1), unless the authorization is terminated or revoked.  Performed at [facility], [address]  [place], [place] [address]     Imaging reviewed:  CT Head Wo Contrast Result Date: 03/01/2024 EXAM: CT HEAD WITHOUT CONTRAST 03/01/2024 02:55:51 PM TECHNIQUE: CT of the head was performed without the administration of intravenous contrast. Automated exposure control, iterative reconstruction, and/or weight based adjustment of the mA/kV was utilized to reduce the radiation dose to as low as reasonably achievable. COMPARISON: CT of the head dated 01/30/2024. CLINICAL HISTORY: Head trauma, minor (Age >= 65y). Family brings patient in d/t worsening dementia. Stating patient is unsafe at home and is starting to wander outside and leaving stuff on the stove. Family states need help placement. FINDINGS: BRAIN AND VENTRICLES: No acute  hemorrhage. Gray-white differentiation is preserved. No hydrocephalus. No extra-axial collection. No mass effect or midline shift. Age-related atrophy and moderately advanced cerebral white matter disease. ORBITS: The patient is status post left lens replacement. SINUSES: No acute abnormality. SOFT TISSUES AND SKULL: No acute soft tissue abnormality. No skull fracture. Moderate calcific atheromatous disease. IMPRESSION: 1. No acute intracranial abnormality. 2. Age-related atrophy and moderately advanced cerebral white matter disease. 3. Moderate calcific atheromatous disease. Electronically signed by: evalene coho 03/01/2024 03:12 PM EDT RP Workstation: [id number]   DG Chest 2 View Result Date: 03/01/2024 CLINICAL DATA:  Altered mental status. Worsening dementia. Ex-smoker. EXAM: CHEST - 2 VIEW COMPARISON:  01/30/2024 FINDINGS: Stable borderline enlarged cardiac silhouette. Interval mild linear atelectasis or scarring in the right mid lung zone. Stable mild diffuse peribronchial thickening and borderline hyperexpansion of the lungs. Thoracic spine degenerative changes. IMPRESSION: 1. Interval mild linear atelectasis or scarring in the right mid lung zone. 2. Stable mild changes of COPD and chronic bronchitis. Electronically Signed   By: Elspeth Bathe M.D.   On: 03/01/2024 14:23    EKG:  Personally reviewed, Afib, rate ctrl at 65, Qtc if 447, likely TWI inferiorly     ED Course:   Treated with Hydralazine  10 mg IV, Clonidine  0.1 mg, Losartan  50 mg, Xanax  0.25, and tylenol .    Assessment/Plan:  88 y.o. male with hx dementia, paroxysmal Afib, HTN, HLD, CKD2, COPD by imaging, BPH, B12 deficiency, anxiety, who is transferred from [facility] ED for HTN urgency/ emergency, also to address dementia with worsening behavioral change and seeking placement.   HTN emergency - resolved  Max BP of 201/152, improved with IV hydralazine  and PO Clonidine , Losartan . He has hx of elevated trop, suspect likely elevated in  setting of severe range HTN although downtrending. His prior home regimen before stopping meds was Losartan  25 mg, and Metoprolol  25 mg.  -- Hydralazine  10 mg IV q 4 hr prn for SBP > 180  -- Change to Amlodipine  10 mg daily considering issues with med adherence will require least monitoring.  -- Continue home Metoprolol  for HTN/Afib (to consolidate meds further could consider swap to Coreg for both BP and HR, although suspect HR would end up as limiting factor and may have inadequate BP control).   Acute myocardial injury  C/o intermittent chest pain, sometimes placing hand over chest per family. EKG without overt ischemic changes. HS trop 66 -> 59. Likely demand in setting of his uncontrolled HTN which is improving. I suspect he has advanced underlying CAD although he is a poor candidate for any procedural intervention due to his issues with medication adherence, discussed this with the daughter.  -- Management directed at HTN per above.  Dementia with worsening behavioral change, wandering behavior  Hx of multiple ground level falls.  On exam appears atraumatic. CT Head  without acute process. Do not feel additional imaging needed at this time.  -- Trial of Seroquel  25 mg nightly, discussed with daughter in agreement with plan  -- Haldol  1 mg IV q 6 hr prn for agitation if not redirectable and risk to self or others  -- PT / OT evaluation, TOC for placement  -- Delirium precautions, fall precautions   Chronic medical problems:  Paroxysmal afib: Continue home Metoprolol , no AC in setting of adherence issues and multiple falls.  HLD: Not taking cholesterol lowering agents  CKD2: Baseline Cr ~ 1.1, minimal elevation to 1.3 at admission. Check PVR with hx BPH  COPD: Nebs prn  BPH: Continue home tamsulosin   B12 deficiency: Continue IM injections, can recheck here and give injection Anxiety: Previously taking Xanax  up to 0.5 mg BID. Currently ordered for 0.25 mg BID, Would use sparingly  considering his falls.  There is no height or weight on file to calculate BMI.    DVT prophylaxis:  Lovenox  Code Status:  DNR/DNI(Do NOT Intubate); confirmed with daughter  Diet:  Diet Orders (From admission, onward)    None      Family Communication:  Yes discussed with daughter at bedside   Consults:  None   Admission status:   Observation, Telemetry bed  Severity of Illness: The appropriate patient status for this patient is OBSERVATION. Observation status is judged to be reasonable and necessary in order to provide the required intensity of service to ensure the patient's safety. The patient's presenting symptoms, physical exam findings, and initial radiographic and laboratory data in the context of their medical condition is felt to place them at decreased risk for further clinical deterioration. Furthermore, it is anticipated that the patient will be medically stable for discharge from the hospital within 2 midnights of admission.    Dorn Dawson, MD [facility]  How to contact the [facility] Attending or Consulting provider 7A - 7P or covering provider during after hours 7P -7A, for this patient.  Check the care team in [facility] and look for a) attending/consulting [facility] provider listed and b) the [facility] team listed Log into [email or web address] and use [facility]'s universal password to access. If you do not have the password, please contact the hospital operator. Locate the [facility] provider you are looking for under [facility] and page to a number that you can be directly reached. If you still have difficulty reaching the provider, please page the [facility] (Director on Call) for the Hospitalists listed on amion for assistance.  03/01/2024, 7:34 PM

## 2024-03-01 NOTE — ED Provider Notes (Signed)
 88 yo male w/ hx of A Fib, dementia, here with multiple concerns, including chest pain (difficult to determine chronicity) and family concerns for worsening confusion and dementia  Pt reportedly not taking home meds or not reliably  Initial labs showed trop 60 - repeat pending. CT head pending Remaining labs near baseline levels, UA without evidence of infection  ECG shows rate controlled A Fib  Physical Exam  BP (!) 153/114 (BP Location: Left Arm)   Pulse 68   Temp 98.7 F (37.1 C) (Oral)   Resp 18   SpO2 98%   Physical Exam  Procedures  .Critical Care  Performed by: Cottie Donnice PARAS, MD Authorized by: Cottie Donnice PARAS, MD   Critical care provider statement:    Critical care time (minutes):  30   Critical care time was exclusive of:  Separately billable procedures and treating other patients   Critical care was necessary to treat or prevent imminent or life-threatening deterioration of the following conditions:  Circulatory failure   Critical care was time spent personally by me on the following activities:  Ordering and performing treatments and interventions, ordering and review of laboratory studies, ordering and review of radiographic studies, pulse oximetry, review of old charts, examination of patient and evaluation of patient's response to treatment   Care discussed with: admitting provider   Comments:     Hypertensive urgency BP management   ED Course / MDM    Medical Decision Making Amount and/or Complexity of Data Reviewed Labs: ordered. Radiology: ordered.  Risk OTC drugs. Prescription drug management. Decision regarding hospitalization.   Labs and workup reviewed.  Trop mildly elevated but flat on repeat.  Pt complaining of some headache and dizziness.  CT head without acute stroke  BP markedly elevated - though I suspect there is underlying untreated HTN at home.  We've tried oral and now will start IV BP medications for improvement.  Plan to admit  for hypertensive urgency evaluation.  Pt has not had echocardiogram in the past and may benefit from this; consideration of MRI if persistent dizziness symptoms do not improve with BP medications.    BP improving on reassessment, now 170/102, will continue to monitor.  Patient admitted to hospitalist    Cottie Donnice PARAS, MD 03/01/24 [phone]

## 2024-03-01 NOTE — Progress Notes (Signed)
 Please Call [facility] at [phone] re: Pt Michael Bailey / Room: [facility].   Dr.Trifan: CC: placement. Hypertensive urgency. Daughter says he has dementia.  Chest pain/ headache and dizziness.  Vitals:   03/01/24 1236 03/01/24 1545 03/01/24 1708  BP: (!) 153/114 (!) 196/123 (!) 201/152   201 is manual and IV hydralazine  is given. TNI is mildly high.  AKI with creatinine of 1.33. Pt has been having memory issue.  Pt accepted cardiac or med tele.

## 2024-03-01 NOTE — ED Triage Notes (Signed)
 Family brings patient in d/t worsening dementia. Stating patient is unsafe at home and is starting to wander outside and leaving stuff on the stove. Family states need help placement.

## 2024-03-01 NOTE — ED Notes (Signed)
 Called [facility] to transport patient to [facility] rm# 3

## 2024-03-02 ENCOUNTER — Other Ambulatory Visit: Payer: Self-pay

## 2024-03-02 DIAGNOSIS — F03918 Unspecified dementia, unspecified severity, with other behavioral disturbance: Secondary | ICD-10-CM | POA: Diagnosis not present

## 2024-03-02 DIAGNOSIS — R7989 Other specified abnormal findings of blood chemistry: Secondary | ICD-10-CM

## 2024-03-02 DIAGNOSIS — F0390 Unspecified dementia without behavioral disturbance: Secondary | ICD-10-CM

## 2024-03-02 DIAGNOSIS — I4811 Longstanding persistent atrial fibrillation: Secondary | ICD-10-CM | POA: Diagnosis not present

## 2024-03-02 DIAGNOSIS — Z91199 Patient's noncompliance with other medical treatment and regimen due to unspecified reason: Secondary | ICD-10-CM

## 2024-03-02 DIAGNOSIS — R296 Repeated falls: Secondary | ICD-10-CM

## 2024-03-02 DIAGNOSIS — I160 Hypertensive urgency: Secondary | ICD-10-CM | POA: Diagnosis not present

## 2024-03-02 DIAGNOSIS — I10 Essential (primary) hypertension: Secondary | ICD-10-CM

## 2024-03-02 DIAGNOSIS — R9431 Abnormal electrocardiogram [ECG] [EKG]: Secondary | ICD-10-CM

## 2024-03-02 LAB — COMPREHENSIVE METABOLIC PANEL WITH GFR
ALT: 10 U/L (ref 0–44)
AST: 15 U/L (ref 15–41)
Albumin: 3.1 g/dL — ABNORMAL LOW (ref 3.5–5.0)
Alkaline Phosphatase: 53 U/L (ref 38–126)
Anion gap: 13 (ref 5–15)
BUN: 13 mg/dL (ref 8–23)
CO2: 18 mmol/L — ABNORMAL LOW (ref 22–32)
Calcium: 9 mg/dL (ref 8.9–10.3)
Chloride: 108 mmol/L (ref 98–111)
Creatinine, Ser: 1.18 mg/dL (ref 0.61–1.24)
GFR, Estimated: >60 mL/min (ref >60)
Glucose, Bld: 92 mg/dL (ref 70–99)
Potassium: 4.3 mmol/L (ref 3.5–5.1)
Sodium: 139 mmol/L (ref 135–145)
Total Bilirubin: 1.3 mg/dL — ABNORMAL HIGH (ref 0.0–1.2)
Total Protein: 6.3 g/dL — ABNORMAL LOW (ref 6.5–8.1)

## 2024-03-02 LAB — CBC WITH DIFFERENTIAL/PLATELET
Abs Immature Granulocytes: 0.03 K/uL (ref 0.00–0.07)
Basophils Absolute: 0 K/uL (ref 0.0–0.1)
Basophils Relative: 1 %
Eosinophils Absolute: 0.2 K/uL (ref 0.0–0.5)
Eosinophils Relative: 3 %
HCT: 47.4 % (ref 39.0–52.0)
Hemoglobin: 15.5 g/dL (ref 13.0–17.0)
Immature Granulocytes: 1 %
Lymphocytes Relative: 32 %
Lymphs Abs: 1.5 K/uL (ref 0.7–4.0)
MCH: 29.7 pg (ref 26.0–34.0)
MCHC: 32.7 g/dL (ref 30.0–36.0)
MCV: 90.8 fL (ref 80.0–100.0)
Monocytes Absolute: 0.6 K/uL (ref 0.1–1.0)
Monocytes Relative: 13 %
Neutro Abs: 2.3 K/uL (ref 1.7–7.7)
Neutrophils Relative %: 50 %
Platelets: 160 K/uL (ref 150–400)
RBC: 5.22 MIL/uL (ref 4.22–5.81)
RDW: 14 % (ref 11.5–15.5)
WBC: 4.7 K/uL (ref 4.0–10.5)
nRBC: 0 % (ref 0.0–0.2)

## 2024-03-02 LAB — VITAMIN B12: Vitamin B-12: 158 pg/mL — ABNORMAL LOW (ref 180–914)

## 2024-03-02 LAB — MAGNESIUM: Magnesium: 2.2 mg/dL (ref 1.7–2.4)

## 2024-03-02 LAB — PHOSPHORUS: Phosphorus: 4.2 mg/dL (ref 2.5–4.6)

## 2024-03-02 LAB — TSH: TSH: 4.061 u[IU]/mL (ref 0.350–4.500)

## 2024-03-02 MED ORDER — SODIUM BICARBONATE 650 MG PO TABS
650.0000 mg | ORAL_TABLET | Freq: Two times a day (BID) | ORAL | Status: DC
  Administered 2024-03-02 – 2024-03-07 (×14): 650 mg via ORAL
  Filled 2024-03-02 (×11): qty 1

## 2024-03-02 NOTE — Evaluation (Signed)
 Physical Therapy Evaluation Patient Details Name: Michael Bailey MRN: [id number] DOB: 03/25/1936 Today's Date: 03/02/2024  History of Present Illness  Pt is a 88 y.o. male admitted from home with worsening dementia and chest pains. EKG showed afib without acute ST changes. Labs negative for UTI, flu, and RSV. Elevated tropinin levels, and hypertensive, likely d/t med noncompliance. PMH: paroxysmal afib, HLD, COPD, dementia.  Clinical Impression  Pt admitted with/for the problems stated above.  He is unsteady for mobility OOB and on his feet, but forgets or refuses to use a RW/rollator for safety.  Overall pt needing moderate assist.  Pt currently limited functionally due to the problems listed. ( See problems list.)   Pt will benefit from PT to maximize function and safety in order to get ready for next venue listed below.         If plan is discharge home, recommend the following: A little help with walking and/or transfers;A little help with bathing/dressing/bathroom;Assistance with cooking/housework;Assist for transportation;Help with stairs or ramp for entrance   Can travel by private vehicle        Equipment Recommendations    Recommendations for Other Services       Functional Status Assessment Patient has had a recent decline in their functional status and demonstrates the ability to make significant improvements in function in a reasonable and predictable amount of time.     Precautions / Restrictions Precautions Precautions: Fall      Mobility  Bed Mobility Overal bed mobility: Modified Independent             General bed mobility comments: No assist    Transfers Overall transfer level: Needs assistance Equipment used: 1 person hand held assist Transfers: Sit to/from Stand, Bed to chair/wheelchair/BSC Sit to Stand: Contact guard assist   Step pivot transfers: Contact guard assist       General transfer comment: cues for hand placement and min assist for  stability once up standing at EOB./    Ambulation/Gait Ambulation/Gait assistance: Mod assist Gait Distance (Feet): 140 Feet Assistive device: 1 person hand held assist Gait Pattern/deviations: Step-through pattern, Decreased step length - right, Decreased step length - left, Decreased stride length, Staggering right, Scissoring (hip drop more L than R due to pelvic/hip weakness.)   Gait velocity interpretation: <1.8 ft/sec, indicate of risk for recurrent falls   General Gait Details: pt's gait is moderately unstable from the beginning, pt grabbing from the next stationary surface. Moderate assist does not give enough stability from the L side.  With rail on the right, stability assist is adequate at best.  Pt needs the assist of RW for both UE's  Stairs            Wheelchair Mobility     Tilt Bed    Modified Rankin (Stroke Patients Only)       Balance Overall balance assessment: Needs assistance Sitting-balance support: No upper extremity supported, Feet supported Sitting balance-Leahy Scale: Fair     Standing balance support: No upper extremity supported, During functional activity, Reliant on assistive device for balance, Single extremity supported Standing balance-Leahy Scale: Poor Standing balance comment: Pt requiring single UE support at all times during functional tasks, but 2 would be much improved.                             Pertinent Vitals/Pain Pain Assessment Pain Assessment: Faces Faces Pain Scale: No hurt Pain Intervention(s): Monitored during session  Home Living Family/patient expects to be discharged to:: Private residence Living Arrangements: Alone Available Help at Discharge: Family;Available PRN/intermittently (daughter check in on daily) Type of Home: House Home Access: Ramped entrance       Home Layout: One level Home Equipment: Grab bars - tub/shower;Rolling Walker (2 wheels);Cane - single point Additional Comments: All  home set up achieved from daughter    Prior Function Prior Level of Function : History of Falls (last six months);Independent/Modified Independent             Mobility Comments: Hx of frequent falls, prn use of RW and SPC, uses meals on wheels ADLs Comments: Per daughter ind with ADLs, does not take meds appropriately, has become increased safety hazard with pt living alone     Extremity/Trunk Assessment   Upper Extremity Assessment Upper Extremity Assessment: Defer to OT evaluation    Lower Extremity Assessment Lower Extremity Assessment: Generalized weakness (bil decrease coordination)    Cervical / Trunk Assessment Cervical / Trunk Assessment: Kyphotic  Communication   Communication Communication: Impaired Factors Affecting Communication: Hearing impaired    Cognition Arousal: Alert Behavior During Therapy: WFL for tasks assessed/performed   PT - Cognitive impairments: History of cognitive impairments                         Following commands: Impaired Following commands impaired: Follows one step commands inconsistently, Follows one step commands with increased time     Cueing Cueing Techniques: Verbal cues, Visual cues     General Comments      Exercises     Assessment/Plan    PT Assessment Patient needs continued PT services  PT Problem List Decreased strength;Decreased activity tolerance;Decreased balance;Decreased mobility;Decreased knowledge of use of DME;Decreased safety awareness;Decreased knowledge of precautions       PT Treatment Interventions DME instruction;Gait training;Functional mobility training;Therapeutic activities;Stair training;Balance training;Neuromuscular re-education;Patient/family education    PT Goals (Current goals can be found in the Care Plan section)  Acute Rehab PT Goals Patient Stated Goal: pt unable PT Goal Formulation: Patient unable to participate in goal setting Time For Goal Achievement:  03/16/24 Potential to Achieve Goals: Good    Frequency Min 2X/week     Co-evaluation               AM-PAC PT 6 Clicks Mobility  Outcome Measure Help needed turning from your back to your side while in a flat bed without using bedrails?: A Little   Help needed moving to and from a bed to a chair (including a wheelchair)?: A Little Help needed standing up from a chair using your arms (e.g., wheelchair or bedside chair)?: A Little Help needed to walk in hospital room?: A Lot Help needed climbing 3-5 steps with a railing? : A Lot 6 Click Score: 13    End of Session   Activity Tolerance: Patient tolerated treatment well;Patient limited by fatigue Patient left: in bed;with call bell/phone within reach;with bed alarm set Nurse Communication: Mobility status PT Visit Diagnosis: Unsteadiness on feet (R26.81);Other abnormalities of gait and mobility (R26.89);Muscle weakness (generalized) (M62.81)    Time: 8265-8244 PT Time Calculation (min) (ACUTE ONLY): 21 min   Charges:   PT Evaluation $PT Eval Moderate Complexity: 1 Mod   PT General Charges $$ ACUTE PT VISIT: 1 Visit         03/02/2024  India HERO., PT Acute Rehabilitation Services [phone]  (office)  Vinie GAILS Josselyn Harkins 03/02/2024, 6:33 PM

## 2024-03-02 NOTE — Progress Notes (Signed)
 PROGRESS NOTE    [name]  FMW:[id number] DOB: 02/10/1936 DOA: 03/01/2024 PCP: Suanne Pfeiffer, NP   Brief Narrative:  The patient Michael Bailey is an 88 year old Caucasian male with a past medical history significant for but not limited to dementia, proximal atrial fibrillation, essential hypertension, hyperlipidemia, COPD by imaging, BPH, B12 deficiency, anxiety and other comorbidities who was [facility] emergency.  Notably there is also concern for worsening dementia as patient lives alone prior to admission and has worsened over the last few months.  Notably that daughter has installed cameras inside and out of his house and he has had multiple falls, wandering behavior and hallucinations with his last fall being the night before admission.  He recently walked for the model and was found in the field and walked outside for rain but unable had to redirect him.  Patient is pleasantly demented so a subjective history is not obtained from him but per his daughter his main complaints were headache and chest discomfort.  Patient's daughter reports that sometimes when she comes out of his house he will place his hand on his chest and in the last 6 months he stopped taking the medications.  He is brought to the ED and found to have behavioral changes and hypertensive urgency/emergency which is improved with medications.  Cardiology was consulted given that A-fib with a slow ventricular response and they recommended holding AV nodal blocking agents and recommended against oral anticoagulation given his underlying dementia, noncompliance of medications and recurrent falls.  Cardiology felt that could also not rule out ischemia given his high blood pressure and agitation but he did not clinically have a heart failure picture and recommended modifiable cardiovascular risk factors and did not recommend ischemic workup.  Palliative care has been consulted for further  goals of care discussion and PT OT evaluating and recommending SNF.  Assessment and Plan:  HTN emergency - resolved: Max BP of 201/152, improved with IV hydralazine  and PO Clonidine , Losartan . He has hx of elevated trop, suspect likely elevated in setting of severe range HTN although downtrending. His prior home regimen before stopping meds was Losartan  25 mg, and Metoprolol  25 mg. C/w Hydralazine  10 mg IV q 4 hr prn for SBP > 180  -Changed to Amlodipine  10 mg daily considering issues with med adherence will require least monitoring. Continued home Metoprolol  for HTN/Afib initially however given his A-fib with slow ventricular response this will be discontinued.  Cardiology recommends avoiding AV nodal blocking agents.  Continue monitor blood pressures per protocol.  Last blood pressure reading was 130/76   Acute myocardial injury: C/o intermittent chest pain, sometimes placing hand over chest per family. EKG without overt ischemic changes. HS trop 66 -> 59. Likely demand in setting of his uncontrolled HTN which is improving. Suspect he has advanced underlying CAD although he is a poor candidate for any procedural intervention due to his issues with medication adherence, discussed this with the daughter.  Cardiology was consulted and recommending against ischemic workup at this time. Management directed at HTN per above.   Dementia with worsening behavioral change, wandering behavior  Hx of multiple ground level falls.  On exam appears atraumatic. CT Head without acute process. Do not feel additional imaging needed at this time. Trial of Seroquel  25 mg nightly, discussed with daughter in agreement with plan.  Haldol  1 mg IV q 6 hr prn for agitation if not redirectable and risk to self or others  -- PT / OT  evaluation, TOC for placement as they are recommending SNF -- Delirium precautions, fall precautions  -Palliative have consulted for further goals of care discussion; U/A Negative    Paroxysmal A  Fib but now with slow ventricular response: Continued home Metoprolol  until patient became bradycardic, no AC in setting of adherence issues and multiple falls.  Cardiology evaluated and recommends avoiding AV nodal blocking agents  HLD: Not taking cholesterol lowering agents   CKD2 / Metabolic Acidosis: Baseline Cr ~ 1.1, minimal elevation to 1.3 at admission. Check PVR with hx BPH. -BUN/Cr Trend: Recent Labs  Lab 03/01/24 1340 03/02/24 0747  BUN 15 13  CREATININE 1.33* 1.18  -Has a slight metabolic acidosis with a CO2 of 18, anion gap of 13, chloride level of 108. -Avoid Nephrotoxic Medications, Contrast Dyes, Hypotension and Dehydration to Ensure Adequate Renal Perfusion and will need to Renally Adjust Meds. CTM  and Trend Renal Function carefully and repeat CMP in the AM   Dysphagia: Patient was coughing w/ Drinking. Check SLP  COPD: C/w Nebs prn   BPH: Continue home tamsulosin    B12 deficiency: B12 was 158. Continue IM injections, can recheck here and give injection  Anxiety: Previously taking Xanax  up to 0.5 mg BID. Currently ordered for 0.25 mg BID, Would use sparingly considering his falls.  Hyperbilirubinemia: T Bili is now 1.3. CTM and Trend and repeat CMP in the AM   Hypoalbuminemia: Patient's Albumin Lvl went from 4.1 -> 3.1. CTM and Trend and repeat CMP in the AM   DVT prophylaxis: enoxaparin  (LOVENOX ) injection 40 mg Start: 03/01/24 2200    Code Status: Limited: Do not attempt resuscitation (DNR) -DNR-LIMITED -Do Not Intubate/DNI  Family Communication: No family currently at bedside  Disposition Plan:  Level of care: Telemetry Cardiac Status is: Observation The patient remains OBS appropriate and will d/c before 2 midnights.   Consultants:  Cardiology  Procedures:  As delineated as above  Antimicrobials:  Anti-infectives (From admission, onward)    None       Subjective: Seen and examined at bedside he is extremely hard of hearing.  Denying current  issues at this time.  Pleasantly confused and demented.  No other concerns or complaints at this time.  Objective: Vitals:   03/02/24 0509 03/02/24 0721 03/02/24 1128 03/02/24 1600  BP: (!) 127/95 121/69 (!) 131/100 130/76  Pulse: (!) 49 (!) 52 (!) 57 61  Resp: 18 20 20 20   Temp: 97.7 F (36.5 C) (!) 96.7 F (35.9 C) (!) 96.7 F (35.9 C) (!) 97 F (36.1 C)  TempSrc: Oral Axillary Axillary Axillary  SpO2: 97% 99% 96% 99%  Weight: 92.2 kg       Intake/Output Summary (Last 24 hours) at 03/02/2024 1746 Last data filed at 03/02/2024 1729 Gross per 24 hour  Intake 450 ml  Output 1600 ml  Net -1150 ml   Filed Weights   03/02/24 0509  Weight: 92.2 kg   Examination: Physical Exam:  Constitutional: Elderly chronically ill-appearing Caucasian male who is hard of hearing and pleasantly demented Respiratory: Diminished to auscultation bilaterally, no wheezing, rales, rhonchi or crackles. Normal respiratory effort and patient is not tachypenic. No accessory muscle use.  Unlabored breathing Cardiovascular: Irregularly irregular but bradycardic, no murmurs / rubs / gallops. S1 and S2 auscultated.  Minimal extremity edema Abdomen: Soft, non-tender, non-distended.  Bowel sounds positive.  GU: Deferred. Musculoskeletal: No clubbing / cyanosis of digits/nails. No joint deformity upper and lower extremities.  Skin: No rashes, lesions, ulcers. No induration; Warm  and dry.  Neurologic: Patient is extremely hard of hearing but other cranial nerves II through XII grossly intact.  Romberg sign and cervical reflexes not assessed. Psychiatric: Impaired judgment and insight.  He is awake and alert but not fully oriented and remains pleasantly confused  Data Reviewed: I have personally reviewed following labs and imaging studies  CBC: Recent Labs  Lab 03/01/24 1340 03/02/24 0747  WBC 6.2 4.7  NEUTROABS 3.5 2.3  HGB 15.6 15.5  HCT 47.7 47.4  MCV 91.4 90.8  PLT 173 160   Basic Metabolic  Panel: Recent Labs  Lab 03/01/24 1340 03/02/24 0747  NA 140 139  K 4.0 4.3  CL 103 108  CO2 26 18*  GLUCOSE 89 92  BUN 15 13  CREATININE 1.33* 1.18  CALCIUM 9.6 9.0  MG  --  2.2  PHOS  --  4.2   GFR: CrCl cannot be calculated (Unknown ideal weight.). Liver Function Tests: Recent Labs  Lab 03/01/24 1340 03/02/24 0747  AST 17 15  ALT 8 10  ALKPHOS 76 53  BILITOT 0.9 1.3*  PROT 7.2 6.3*  ALBUMIN 4.1 3.1*   No results for input(s): LIPASE, AMYLASE in the last 168 hours. No results for input(s): AMMONIA in the last 168 hours. Coagulation Profile: No results for input(s): INR, PROTIME in the last 168 hours. Cardiac Enzymes: No results for input(s): CKTOTAL, CKMB, CKMBINDEX, TROPONINI in the last 168 hours. BNP (last 3 results) No results for input(s): PROBNP in the last 8760 hours. HbA1C: No results for input(s): HGBA1C in the last 72 hours. CBG: No results for input(s): GLUCAP in the last 168 hours. Lipid Profile: No results for input(s): CHOL, HDL, LDLCALC, TRIG, CHOLHDL, LDLDIRECT in the last 72 hours. Thyroid Function Tests: Recent Labs    03/02/24 0747  TSH 4.061   Anemia Panel: Recent Labs    03/02/24 0747  VITAMINB12 158*   Sepsis Labs: No results for input(s): PROCALCITON, LATICACIDVEN in the last 168 hours.  Recent Results (from the past 240 hours)  Resp panel by RT-PCR (RSV, Flu A&B, Covid) Anterior Nasal Swab     Status: None   Collection Time: 03/01/24  1:41 PM   Specimen: Anterior Nasal Swab  Result Value Ref Range Status   SARS Coronavirus 2 by RT PCR NEGATIVE NEGATIVE Final    Comment: (NOTE) SARS-CoV-2 target nucleic acids are NOT DETECTED.  The SARS-CoV-2 RNA is generally detectable in upper respiratory specimens during the acute phase of infection. The lowest concentration of SARS-CoV-2 viral copies this assay can detect is 138 copies/mL. A negative result does not preclude SARS-Cov-2 infection  and should not be used as the sole basis for treatment or other patient management decisions. A negative result may occur with  improper specimen collection/handling, submission of specimen other than nasopharyngeal swab, presence of viral mutation(s) within the areas targeted by this assay, and inadequate number of viral copies(<138 copies/mL). A negative result must be combined with clinical observations, patient history, and epidemiological information. The expected result is Negative.  Fact Sheet for Patients:  BloggerCourse.com  Fact Sheet for Healthcare Providers:  SeriousBroker.it  This test is no t yet approved or cleared by the [place]  FDA and  has been authorized for detection and/or diagnosis of SARS-CoV-2 by FDA under an Emergency Use Authorization (EUA). This EUA will remain  in effect (meaning this test can be used) for the duration of the COVID-19 declaration under Section 564(b)(1) of the Act, 21 U.S.C.section 360bbb-3(b)(1), unless the  authorization is terminated  or revoked sooner.       Influenza A by PCR NEGATIVE NEGATIVE Final   Influenza B by PCR NEGATIVE NEGATIVE Final    Comment: (NOTE) The Xpert Xpress SARS-CoV-2/FLU/RSV plus assay is intended as an aid in the diagnosis of influenza from Nasopharyngeal swab specimens and should not be used as a sole basis for treatment. Nasal washings and aspirates are unacceptable for Xpert Xpress SARS-CoV-2/FLU/RSV testing.  Fact Sheet for Patients: BloggerCourse.com  Fact Sheet for Healthcare Providers: SeriousBroker.it  This test is not yet approved or cleared by the [place]  FDA and has been authorized for detection and/or diagnosis of SARS-CoV-2 by FDA under an Emergency Use Authorization (EUA). This EUA will remain in effect (meaning this test can be used) for the duration of the COVID-19 declaration  under Section 564(b)(1) of the Act, 21 U.S.C. section 360bbb-3(b)(1), unless the authorization is terminated or revoked.     Resp Syncytial Virus by PCR NEGATIVE NEGATIVE Final    Comment: (NOTE) Fact Sheet for Patients: BloggerCourse.com  Fact Sheet for Healthcare Providers: SeriousBroker.it  This test is not yet approved or cleared by the [place]  FDA and has been authorized for detection and/or diagnosis of SARS-CoV-2 by FDA under an Emergency Use Authorization (EUA). This EUA will remain in effect (meaning this test can be used) for the duration of the COVID-19 declaration under Section 564(b)(1) of the Act, 21 U.S.C. section 360bbb-3(b)(1), unless the authorization is terminated or revoked.  Performed at [facility], [address] [place], [place] [address]     Radiology Studies: CT Head Wo Contrast Result Date: 03/01/2024 EXAM: CT HEAD WITHOUT CONTRAST 03/01/2024 02:55:51 PM TECHNIQUE: CT of the head was performed without the administration of intravenous contrast. Automated exposure control, iterative reconstruction, and/or weight based adjustment of the mA/kV was utilized to reduce the radiation dose to as low as reasonably achievable. COMPARISON: CT of the head dated 01/30/2024. CLINICAL HISTORY: Head trauma, minor (Age >= 65y). Family brings patient in d/t worsening dementia. Stating patient is unsafe at home and is starting to wander outside and leaving stuff on the stove. Family states need help placement. FINDINGS: BRAIN AND VENTRICLES: No acute hemorrhage. Gray-white differentiation is preserved. No hydrocephalus. No extra-axial collection. No mass effect or midline shift. Age-related atrophy and moderately advanced cerebral white matter disease. ORBITS: The patient is status post left lens replacement. SINUSES: No acute abnormality. SOFT TISSUES AND SKULL: No acute soft tissue abnormality. No skull  fracture. Moderate calcific atheromatous disease. IMPRESSION: 1. No acute intracranial abnormality. 2. Age-related atrophy and moderately advanced cerebral white matter disease. 3. Moderate calcific atheromatous disease. Electronically signed by: evalene coho 03/01/2024 03:12 PM EDT RP Workstation: [id number]   DG Chest 2 View Result Date: 03/01/2024 CLINICAL DATA:  Altered mental status. Worsening dementia. Ex-smoker. EXAM: CHEST - 2 VIEW COMPARISON:  01/30/2024 FINDINGS: Stable borderline enlarged cardiac silhouette. Interval mild linear atelectasis or scarring in the right mid lung zone. Stable mild diffuse peribronchial thickening and borderline hyperexpansion of the lungs. Thoracic spine degenerative changes. IMPRESSION: 1. Interval mild linear atelectasis or scarring in the right mid lung zone. 2. Stable mild changes of COPD and chronic bronchitis. Electronically Signed   By: Elspeth Bathe M.D.   On: 03/01/2024 14:23   Scheduled Meds:  amLODipine   10 mg Oral Daily   cyanocobalamin   1,000 mcg Subcutaneous Q30 days   enoxaparin  (LOVENOX ) injection  40 mg Subcutaneous Q24H   melatonin  6 mg Oral QHS  QUEtiapine   25 mg Oral QHS   sodium bicarbonate   650 mg Oral BID   tamsulosin   0.4 mg Oral QPC supper   Continuous Infusions:   LOS: 0 days   Alejandro Marker, DO [facility] Available via Epic secure chat 7am-7pm After these hours, please refer to coverage provider listed on [email or web address] 03/02/2024, 5:46 PM

## 2024-03-02 NOTE — Progress Notes (Signed)
 SLP Cancellation Note  Patient Details Name: Michael Bailey MRN: [id number] DOB: 1936-05-04   Cancelled treatment:       Reason Eval/Treat Not Completed: Patient at procedure or test/unavailable. Pt working with OT. Will f/u as able.     Leita SAILOR., M.A. CCC-SLP Acute Rehabilitation Services Office: [phone]  Secure chat preferred  03/02/2024, 9:09 AM

## 2024-03-02 NOTE — Care Management Obs Status (Signed)
 MEDICARE OBSERVATION STATUS NOTIFICATION   Patient Details  Name: Michael Bailey MRN: [id number] Date of Birth: 10/30/1935   Medicare Observation Status Notification Given:  Yes    Vonzell Arrie Sharps 03/02/2024, 11:45 AM

## 2024-03-02 NOTE — Hospital Course (Addendum)
 The patient Michael Bailey is an 88 year old Caucasian male with a past medical history significant for but not limited to dementia, proximal atrial fibrillation, essential hypertension, hyperlipidemia, COPD by imaging, BPH, B12 deficiency, anxiety and other comorbidities who [facility].  Notably there is also concern for worsening dementia as patient lives alone prior to admission and has worsened over the last few months.  Notably that daughter has installed cameras inside and out of his house and he has had multiple falls, wandering behavior and hallucinations with his last fall being the night before admission.  He recently walked for the model and was found in the field and walked outside for rain but unable had to redirect him.  Patient is pleasantly demented so a subjective history is not obtained from him but per his daughter his main complaints were headache and chest discomfort.  Patient's daughter reports that sometimes when she comes out of his house he will place his hand on his chest and in the last 6 months he stopped taking the medications.  He is brought to the ED and found to have behavioral changes and hypertensive urgency/emergency which is improved with medications.  Cardiology was consulted given that A-fib with a slow ventricular response and they recommended holding AV nodal blocking agents and recommended against oral anticoagulation given his underlying dementia, noncompliance of medications and recurrent falls.  Cardiology felt that could also not rule out ischemia given his high blood pressure and agitation but he did not clinically have a heart failure picture and recommended modifiable cardiovascular risk factors and did not recommend ischemic workup.  Palliative care has been consulted for further goals of care discussion and current plan for the daughter is find a safe disposition for her father and focus on comfort care  approach and continuing palliative evaluation while in rehab and then transitioning to hospice once LTC placement is found.   PT/OT evaluating and recommending SNF and TOC consulted for assistance with discharge disposition; Currently the patient has been medically stable for discharge and now has a bed offer and insurance authorization.  He will be discharged today  Assessment and Plan:  HTN emergency - resolved: Max BP of 201/152, improved with IV hydralazine  and PO Clonidine , Losartan . He has hx of elevated trop, suspect likely elevated in setting of severe range HTN although downtrending. His prior home regimen before stopping meds was Losartan  25 mg, and Metoprolol  25 mg. C/w Hydralazine  10 mg IV q 4 hr prn for SBP > 180  -Changed to Amlodipine  10 mg daily considering issues with med adherence will require least monitoring. Continued home Metoprolol  for HTN/Afib initially however given his A-fib with slow ventricular response this will be discontinued.  Cardiology recommends avoiding AV nodal blocking agents.  Continue monitor blood pressures per protocol.  Last blood pressure reading was a little elevated at 148/95. Will discontinue telemetry monitoring now   Acute myocardial injury: C/o intermittent chest pain, sometimes placing hand over chest per family. EKG without overt ischemic changes. HS trop 66 -> 59. Likely demand in setting of his uncontrolled HTN which is improving. Suspect he has advanced underlying CAD although he is a poor candidate for any procedural intervention due to his issues with medication adherence, discussed this with the daughter.  Cardiology was consulted and recommending against ischemic workup at this time. Management directed at HTN per above.  Discontinue telemetry monitoring; Continues to have Intermittent CP but daughter thinks it is more related to  Anxiety now   Dementia with worsening behavioral change, wandering behavior  Hx of multiple ground level falls.  On  exam appears atraumatic. CT Head without acute process. Do not feel additional imaging needed at this time. Trial of Seroquel  25 mg nightly, discussed with daughter in agreement with plan.  Haldol  1 mg IV q 6 hr prn for agitation if not redirectable and risk to self or others  -PT / OT evaluation, TOC for placement as they are recommending SNF -Delirium precautions, fall precautions  -Palliative consulted for further goals of care discussion; U/A Negative  -Patient's daughter met with the Palliative Team and Daughter is very reasonable and realistic. Her main goal is to find a safe disposition for him, but she is very receptive to comfort-focused care and current plan is for outpatient palliative while in rehab and then transition to hospice once LTC placement is found; He is medically stable to D/C to SNF and has a bed offer.  Patient's daughter has accepted the bed at SNF in [place] and insurance authorization has been obtained and is medically stable for discharge   Paroxysmal A Fib but now with slow ventricular response: Continued home Metoprolol  until patient became bradycardic, no AC in setting of adherence issues and multiple falls.  Cardiology evaluated and recommends avoiding AV nodal blocking agents.  Given that patient is unable to maintain his telemetry and given that he will likely transition to hospice once in LTC will discontinue telemetry monitoring and not check any more labs.  HLD: Not taking cholesterol lowering agents   CKD2 / Metabolic Acidosis: Baseline Cr ~ 1.1, minimal elevation to 1.3 at admission. Check PVR with hx BPH. -BUN/Cr Trend: Recent Labs  Lab 03/01/24 1340 03/02/24 0747 03/03/24 0851  BUN 15 13 18   CREATININE 1.33* 1.18 1.07  -Slight Metabolic Acidosis is improved with a CO2 of 21, anion gap of 12, chloride level of 104 on last check.C/w Sodium Bicarbonate  650 mg po BID for now -Avoid Nephrotoxic Medications, Contrast Dyes, Hypotension and Dehydration to  Ensure Adequate Renal Perfusion and will need to Renally Adjust Meds. CTM  and Trend Renal Function carefully and repeat CMP within 1 week  Dysphagia: Patient was coughing w/ Drinking. Obtained SLP evaluation and MBS was done and the patient demonstrated mild oral and moderate pharyngeal dysphagia with decreased lingual control, decreased laryngeal evaluation and hyoid excursion and reduced tongue base retraction and decreased glottal closure.  Patient was silently aspirating thin liquids and penetrated nectar thick liquids consistently.  The SLP wanted to give him a thickened liquid diet however daughter wanted the patient to have thin liquids and understand the risks of aspiration. C/w D3 thin liquid diet now with Aspiration Precautions.   COPD: C/w Nebs prn   Left Knee Pain: Ordered Voltaren  gel; If not improved with this will consider obtaining Knee Imaging.   BPH: Continue home Tamsulosin  0.4 mg po daily   B12 deficiency: B12 was 158. Continue IM injection of Cyanocobalamin  1000 mcg sq q30 days. Start po Cyanocobalamin  1000 mcg po Daily   Anxiety: Previously taking Xanax  up to 0.5 mg BID. Currently ordered for 0.25 mg BIDprn, Would use sparingly considering his falls.  Hyperbilirubinemia: T Bili is now gone from 1.3 -> 1.6 on last check. CTM and Trend and repeat CMP in the AM   Hypoalbuminemia: Patient's Albumin Lvl went from 4.1 -> 3.1 -> 3.3. CTM and Trend and repeat CMP in the AM

## 2024-03-02 NOTE — Plan of Care (Signed)
   Problem: Education: Goal: Knowledge of General Education information will improve Description: Including pain rating scale, medication(s)/side effects and non-pharmacologic comfort measures Outcome: Not Progressing

## 2024-03-02 NOTE — Plan of Care (Signed)

## 2024-03-02 NOTE — Plan of Care (Signed)
 Patient had episode of agitation at midnight, pulled out IV access and trying to get out of bed, cannot redirected. PRN IV Haldol  given. Safety precautions in place.   Patient had episode of bradycardia as low as 32 bpm while asleep,still Afib on cardiac monitor with slow ventricular rate.  Patient still confused, vital signs are stable. HR ranges 30-50's. Notified MD oncall about the events. EKG completed, updated  physician as needed. Called lab for STAT blood test. Continue to provide care per plan. On coming shift made aware.   Problem: Clinical Measurements: Goal: Will remain free from infection Outcome: Progressing Goal: Respiratory complications will improve Outcome: Progressing   Problem: Elimination: Goal: Will not experience complications related to urinary retention Outcome: Progressing   Problem: Safety: Goal: Ability to remain free from injury will improve Outcome: Progressing   Problem: Skin Integrity: Goal: Risk for impaired skin integrity will decrease Outcome: Progressing

## 2024-03-02 NOTE — Consult Note (Addendum)
 Cardiology Consultation   Patient ID: Michael Bailey MRN: [id number]; DOB: Jul 02, 1936  Admit date: 03/01/2024 Date of Consult: 03/02/2024  PCP:  Michael Pfeiffer, NP   [facility] Providers Cardiologist:  None       CC: dementia Reason of Consult: Atrial Fibrillation Requesting Provider: Dr. Sherrill  Patient Profile: Michael Bailey is a 88 y.o. male with a hx of dementia (Alzheimer's?), paroxysmal afib, hypertension, hyperlipidemia, COPD? who is being seen 03/02/2024 for the evaluation of atrial fibrillation with slow ventricular response at the request of Dr. Sherrill.  History of Present Illness: Michael Bailey was brought to the [facility] department yesterday by family in the setting of worsening dementia.  Family expressed concern that patient unsafe to live at home, noted him to be wandering outside as well. In the emergency department patient also noted headaches and chest discomfort and was found with elevated blood pressure.  According to notes, patient stopped taking his medications approximately 6 months ago and it has been over the last couple of months that family has noticed a more significant decline in his cognitive abilities.  Initial emergency department provider notes that patient has been confused between TV and reality.  He has apparently left the house several times in the last few days without clear reason.  Notes also report patient leaving an item on the stove and forgetting about it, also apparently not hearing his smoke alarm going off.  Patient with insomnia, has been sleeping for just a couple of hours and then believing it is morning.  In the emergency department his EKG noted atrial fibrillation without focal ischemic changes.  Given worsening mental status as well as headaches, CT head ordered.  This did not reveal any acute intracranial abnormality.  Age-related atrophy and moderately advanced cerebral white matter disease seen along with moderate calcific  atheromatous disease.  In the emergency department, complete blood count without abnormality.  Complete metabolic panel with mild AKI, creatinine of 1.33.  High-sensitivity troponin checked, initially 66 with downtrend to 59.  Respiratory virus panel negative.  Urinalysis without evidence of infection.  While in the emergency department, blood pressure as high as 201/176mmHg. given hypertensive urgency, patient transferred to [facility] for admission.  BP now much improved with hydralazine  IV as well as oral clonidine  and losartan .  According to EMR, patient's prior home antihypertensive regimen was losartan  25 mg and metoprolol  25 mg.  Regarding patient's atrial fibrillation, per EMR, he was diagnosed with this by his PCP on Dec 25, 2022.  At that time her notes indicated plan to start metoprolol  and aspirin.  Follow-up notes by PCP later in 2024 indicate that patient had not yet established with a cardiologist.  Per notes, with bradycardia in the 50s, his metoprolol  was decreased to 12.5 mg once daily.  Following this October office visit, no further notes regarding atrial fibrillation until patient presented to the emergency department this past July, 01/30/2024.  This emergency department encounter was prompted by an unwitnessed fall at home.  Imaging workup in the emergency department at that time was negative.  Although his ECG in the emergency department did reveal atrial fibrillation, decision made to defer oral anticoagulation in the setting of advanced dementia and frequent falls.  Furthermore, notes indicate that patient refused to take medicines.   On exam today, patient extremely hard of hearing. Despite extremely loud voice in patient's ear, limited ability to carry on a conversation. HPI also complicated by dementia. Fortunately, daughter was present in the room.  She confirms above story of progressive confusion/wandering especially in the last few weeks. Confirms that patient stopped taking  medications several months ago. She expresses that her primary concern is the safety of her dad. She works long hours and is the only person available to help care for him. He is able to get food via Meals on Wheels. Patient's daughter clear today that he has repeatedly expressed desire to avoid aggressive medical care.  Past Medical History:  Diagnosis Date   Bilateral cataracts    COPD (chronic obstructive pulmonary disease) (HCC)    Hypertension     Past Surgical History:  Procedure Laterality Date   CATARACT EXTRACTION     HAND SURGERY  1968   work related accident       Scheduled Meds:  amLODipine   10 mg Oral Daily   cyanocobalamin   1,000 mcg Subcutaneous Q30 days   enoxaparin  (LOVENOX ) injection  40 mg Subcutaneous Q24H   melatonin  6 mg Oral QHS   QUEtiapine   25 mg Oral QHS   sodium bicarbonate   650 mg Oral BID   tamsulosin   0.4 mg Oral QPC supper   Continuous Infusions:  PRN Meds: acetaminophen , albuterol , ALPRAZolam , haloperidol  lactate, hydrALAZINE , nitroGLYCERIN , ondansetron  (ZOFRAN ) IV, polyethylene glycol  Allergies:   No Known Allergies  Social History:   Social History   Socioeconomic History   Marital status: Widowed    Spouse name: Not on file   Number of children: 6   Years of education: Not on file   Highest education level: Not on file  Occupational History   Not on file  Tobacco Use   Smoking status: Former    Current packs/day: 0.00    Types: Cigarettes    Quit date: 37    Years since quitting: 43.6   Smokeless tobacco: Never  Substance and Sexual Activity   Alcohol use: No   Drug use: Never   Sexual activity: Not on file  Other Topics Concern   Not on file  Social History Narrative   Drinks 2 cups of caffeine qd.   Social Drivers of Health   Financial Resource Strain: Medium Risk (04/30/2023)   Received from [facility]   Overall Financial Resource Strain (CARDIA)    Difficulty of Paying Living Expenses: Somewhat hard  Food  Insecurity: Unknown (03/01/2024)   Hunger Vital Sign    Worried About Running Out of Food in the Last Year: Not on file    Ran Out of Food in the Last Year: Never true  Transportation Needs: No Transportation Needs (03/01/2024)   PRAPARE - Administrator, Civil Service (Medical): No    Lack of Transportation (Non-Medical): No  Physical Activity: Insufficiently Active (04/30/2023)   Received from [facility]   Exercise Vital Sign    On average, how many days per week do you engage in moderate to strenuous exercise (like a brisk walk)?: 5 days    On average, how many minutes do you engage in exercise at this level?: 10 min  Stress: Stress Concern Present (04/30/2023)   Received from [facility] - Occupational Stress Questionnaire    Feeling of Stress : To some extent  Social Connections: Patient Unable To Answer (03/02/2024)   Social Connection and Isolation Panel    Frequency of Communication with Friends and Family: Patient unable to answer    Frequency of Social Gatherings with Friends and Family: Patient unable to answer    Attends  Religious Services: Patient unable to answer    Active Member of Clubs or Organizations: Patient unable to answer    Attends Club or Organization Meetings: Patient unable to answer    Marital Status: Patient unable to answer  Intimate Partner Violence: Not At Risk (03/01/2024)   Humiliation, Afraid, Rape, and Kick questionnaire    Fear of Current or Ex-Partner: No    Emotionally Abused: No    Physically Abused: No    Sexually Abused: No    Family History:    Family History  Problem Relation Age of Onset   Cancer Mother 77     ROS:  Review of Systems  HENT:         Hard of hearing  Cardiovascular:  Negative for chest pain, claudication, irregular heartbeat, leg swelling, near-syncope, orthopnea, palpitations, paroxysmal nocturnal dyspnea and syncope.  Respiratory:  Negative for shortness of  breath.   Hematologic/Lymphatic: Negative for bleeding problem.  Psychiatric/Behavioral:         Dementia   Physical Exam/Data: Vitals:   03/01/24 2326 03/02/24 0509 03/02/24 0721 03/02/24 1128  BP: (!) 140/93 (!) 127/95 121/69 (!) 131/100  Pulse: (!) 58 (!) 49 (!) 52 (!) 57  Resp: 20 18 20 20   Temp: 98 F (36.7 C) 97.7 F (36.5 C) (!) 96.7 F (35.9 C) (!) 96.7 F (35.9 C)  TempSrc: Oral Oral Axillary Axillary  SpO2: 98% 97% 99% 96%  Weight:  92.2 kg      Intake/Output Summary (Last 24 hours) at 03/02/2024 1456 Last data filed at 03/02/2024 1419 Gross per 24 hour  Intake 450 ml  Output 1400 ml  Net -950 ml      03/02/2024    5:09 AM 08/13/2022   11:22 AM 04/28/2022    9:18 AM  Last 3 Weights  Weight (lbs) 203 lb 4.2 oz 221 lb 221 lb  Weight (kg) 92.2 kg 100.245 kg 100.245 kg     Body mass index is 26.1 kg/m.  General:  Well nourished, well developed, in no acute distress. Hard of hearing. HEENT: normal Neck: no JVD Vascular: No carotid bruits; Distal pulses 2+ bilaterally Cardiac:  normal S1, S2; irregularly irregular; no murmur  Lungs:  clear to auscultation bilaterally, no wheezing, rhonchi or rales  Abd: soft, nontender, no hepatomegaly  Ext: no edema Musculoskeletal:  No deformities, BUE and BLE strength normal and equal Skin: warm and dry  Neuro:  CNs 2-12 intact, no focal abnormalities noted Psych:  pleasantly confused  EKG:  The EKG was personally reviewed and demonstrates: Most recent ECG shows coarse atrial fibrillation with ventricular rate of 49 bpm.  Nonspecific anterior/lateral T wave inversions.  Low lateral T wave inversions are seen on prior EKGs.  Interestingly 1331 EKG from yesterday does not show these. Telemetry:  Telemetry was personally reviewed and demonstrates:  persistent atrial fibrillation. Rates 60s yesterday, overnight into the 30s with isolated pauses ~2 seconds.  Relevant CV Studies: N/A  Laboratory Data: High Sensitivity Troponin:  No  results for input(s): TROPONINIHS in the last 720 hours.   Chemistry Recent Labs  Lab 03/01/24 1340 03/02/24 0747  NA 140 139  K 4.0 4.3  CL 103 108  CO2 26 18*  GLUCOSE 89 92  BUN 15 13  CREATININE 1.33* 1.18  CALCIUM 9.6 9.0  MG  --  2.2  GFRNONAA 52* >60  ANIONGAP 12 13    Recent Labs  Lab 03/01/24 1340 03/02/24 0747  PROT 7.2 6.3*  ALBUMIN 4.1  3.1*  AST 17 15  ALT 8 10  ALKPHOS 76 53  BILITOT 0.9 1.3*   Lipids No results for input(s): CHOL, TRIG, HDL, LABVLDL, LDLCALC, CHOLHDL in the last 168 hours.  Hematology Recent Labs  Lab 03/01/24 1340 03/02/24 0747  WBC 6.2 4.7  RBC 5.22 5.22  HGB 15.6 15.5  HCT 47.7 47.4  MCV 91.4 90.8  MCH 29.9 29.7  MCHC 32.7 32.7  RDW 14.0 14.0  PLT 173 160   Thyroid  Recent Labs  Lab 03/02/24 0747  TSH 4.061    BNPNo results for input(s): BNP, PROBNP in the last 168 hours.  DDimer No results for input(s): DDIMER in the last 168 hours.  Radiology/Studies:  CT Head Wo Contrast Result Date: 03/01/2024 EXAM: CT HEAD WITHOUT CONTRAST 03/01/2024 02:55:51 PM TECHNIQUE: CT of the head was performed without the administration of intravenous contrast. Automated exposure control, iterative reconstruction, and/or weight based adjustment of the mA/kV was utilized to reduce the radiation dose to as low as reasonably achievable. COMPARISON: CT of the head dated 01/30/2024. CLINICAL HISTORY: Head trauma, minor (Age >= 65y). Family brings patient in d/t worsening dementia. Stating patient is unsafe at home and is starting to wander outside and leaving stuff on the stove. Family states need help placement. FINDINGS: BRAIN AND VENTRICLES: No acute hemorrhage. Gray-white differentiation is preserved. No hydrocephalus. No extra-axial collection. No mass effect or midline shift. Age-related atrophy and moderately advanced cerebral white matter disease. ORBITS: The patient is status post left lens replacement. SINUSES: No acute  abnormality. SOFT TISSUES AND SKULL: No acute soft tissue abnormality. No skull fracture. Moderate calcific atheromatous disease. IMPRESSION: 1. No acute intracranial abnormality. 2. Age-related atrophy and moderately advanced cerebral white matter disease. 3. Moderate calcific atheromatous disease. Electronically signed by: evalene coho 03/01/2024 03:12 PM EDT RP Workstation: [id number]   DG Chest 2 View Result Date: 03/01/2024 CLINICAL DATA:  Altered mental status. Worsening dementia. Ex-smoker. EXAM: CHEST - 2 VIEW COMPARISON:  01/30/2024 FINDINGS: Stable borderline enlarged cardiac silhouette. Interval mild linear atelectasis or scarring in the right mid lung zone. Stable mild diffuse peribronchial thickening and borderline hyperexpansion of the lungs. Thoracic spine degenerative changes. IMPRESSION: 1. Interval mild linear atelectasis or scarring in the right mid lung zone. 2. Stable mild changes of COPD and chronic bronchitis. Electronically Signed   By: Elspeth Bathe M.D.   On: 03/01/2024 14:23     Assessment and Plan:  Persistent atrial fibrillation Slow ventricular response As noted above, patient diagnosed with atrial fibrillation in 2024 by his PCP.  Unable to see tracings between time of diagnosis and July 2025 EKG, so not clear if patient had intermittent or persistent atrial fibrillation.  Given atrial fibrillation noted during this admission, I suspect he has been persistently in atrial fibrillation since at least July of this year.  According to EMR, patient on metoprolol  succinate 12.5 mg prior to this admission.  As noted above, he was previously taking 25 mg but this was decreased to 12.5 mg in October 2024 by his PCP in the setting of bradycardia with heart rate in the 50s.  Has not ever been on oral anticoagulation due to progressive dementia and frequent falls. Per chart review, overnight patient with an episode of bradycardia with heart rate as low as 32 while asleep.  He did  receive metoprolol  succinate 25 mg last night at approximately 9:20 PM.  This has since been held.  Heart rate this morning in the low to mid 50s.  Challenging situation given patient's progressive dementia.  Given this along with fall risk, favor conservative management of his atrial fibrillation.  Would not recommend oral anticoagulation.   Regarding patient's beta-blocker, as noted above was previously on 25 mg of metoprolol  succinate but more recently scheduled to take 12.5 mg after he previously demonstrated bradycardia on higher dose.  Given that he received 25 mg last night and developed overnight slow ventricular response, this would suggest that 12.5 mg dose is most appropriate for him at this point. It may be that patient doesn't need any rate control at this point.  Patient/daughter goals of care are reasonably focused on quality of life and safety. She agrees with no anticoagulation after discussing the risk vs. benefit ratio.   Hypertensive urgency Patient noted to have hypertensive urgency with blood pressure as high as 201/152, presumably in the setting of medication noncompliance with family noting patient not taking medicines for at least the last several months.  Previously was taking losartan  and metoprolol  succinate.  BP now controlled with a combination of IV and oral agents. Continue amlodipine  10 mg as ordered by primary team.  Although ARB would be a preferable antihypertensive class, using amlodipine  instead very reasonable in order to minimize need for follow-up labs.  Chest pain Elevated troponin Patient with nonspecific chest discomfort reported this admission in the setting of hypertensive urgency.  Troponin minimally elevated and flat, 66, 59.  His EKG does not reveal acute ischemic changes.  Overall this is not suggestive of ACS, but rather demand ischemia in the setting of significantly elevated blood pressure.  In light of progressive dementia, acute agitation needing  Haldol , not taking medications, no ACS, and flat hs troponin like due  to hypertensive urgency would not recommend further ischemic evaluation at this time.  Per primary team Dementia with chronic falls  Risk Assessment/Risk Scores:    CHA2DS2-VASc Score = 3   This indicates a 3.2% annual risk of stroke. The patient's score is based upon: CHF History: 0 HTN History: 1 Diabetes History: 0 Stroke History: 0 Vascular Disease History: 0 Age Score: 2 Gender Score: 0   For questions or updates, please contact [facility] Please consult [email or web address] for contact info under    Signed, Artist Pouch, PA-C  03/02/2024 1:01 PM  ADDENDUM:   Patient seen and examined with [facility] PA-C.  I personally taken a history, examined the patient, reviewed relevant notes,  laboratory data / imaging studies.  I performed a substantive portion of this encounter and formulated the important aspects of the plan.  I agree with the APP's note, impression, and recommendations; however, I have edited the note to reflect changes or salient points.   Patient seen and examined at bedside Resting in bed comfortably. Poor historian. Spoke to the patient's daughter over the phone Kandra) who states that patient has had a history of atrial fibrillation and has been on rate control strategy with Toprol .  Oral anticoagulation was never started due to concerns for falls, risk of bleeding, and patient's wishes.  Last 6 months he stopped taking all of his medications as well.  Has had falls in the recent past.  She does not endorse complaining of any chest pain with exertion or relieved with rest.  She denies heart failure symptoms as well.  PHYSICAL EXAM: Today's Vitals   03/01/24 2326 03/02/24 0509 03/02/24 0721 03/02/24 1128  BP: (!) 140/93 (!) 127/95 121/69 (!) 131/100  Pulse: (!) 58 (!) 49 (!) 52 ROLLEN)  57  Resp: 20 18 20 20   Temp: 98 F (36.7 C) 97.7 F (36.5 C) (!) 96.7 F (35.9 C) (!) 96.7 F  (35.9 C)  TempSrc: Oral Oral Axillary Axillary  SpO2: 98% 97% 99% 96%  Weight:  92.2 kg    PainSc:   0-No pain    Body mass index is 26.1 kg/m.   Net IO Since Admission: -950 mL [03/02/24 1456]  Filed Weights   03/02/24 0509  Weight: 92.2 kg    General: Appears older than stated age, pleasant, hemodynamically stable, no acute distress HEENT: Normocephalic, atraumatic, no JVD, trachea midline, dry mucous membranes Lungs:  clear to auscultation bilaterally, no wheezes rales or rhonchi's Heart: Irregularly irregular, variable S1-S2, bradycardic, no murmurs rubs or gallops appreciated Abdomen: Soft, nontender, nondistended, positive bowel sounds in all 4 quadrants Extremities: No pitting edema, warm to touch Neuro: Moves all 4 extremities, alert to self. Psych: Pleasant, dementia/cognitive impairment  EKG: (personally reviewed by me) 03/01/2024: Atrial fibrillation, 84 bpm, subtle ST-T changes inferolateral leads 03/01/2024: Atrial fibrillation, 65 bpm, no ST-T changes. 03/02/2024 atrial fibrillation slow ventricular rate, TWI lateral leads consider ischemia, development Monitoring pattern   Telemetry: (personally reviewed by me) Atrial patient with slow ventricular response   Impression & Recommendations: :  Persistent atrial fibrillation with slow ventricular response Hypertensive urgency on arrival Abnormal EKG Elevated troponin not suggestive of ACS Dementia Noncompliance Recurrent falls  Patient has a known history of persistent atrial fibrillation diagnosed likely in 2024 with PCP.  Patient's been on rate control strategy with metoprolol .  Not on anticoagulation.    Clinically he is pleasantly demented unable to HPI.  Spoke to her daughter follow-up with phone.  Patient's ventricular rate well-controlled recommend holding AV nodal blocking agents.  Spoke to the patient's daughter with regards to oral anticoagulation given his CHA2DS2-VASc score.  However, his  daughter participated in shared decision process to continue to hold anticoagulation given underlying dementia, not taking his medications, and recurrent falls.  She understands not being on anticoagulation does predispose him to strokes/TIAs.  But it is felt that the risk of anticoagulation improvement would benefit in the clinical situation.  Blood pressure management per primary team.  Currently on amlodipine , acceptable.  Recommend either ACE/ARB's or hydralazine .  EKG notes transient TWI in the lateral leads cannot entirely rule out ischemia.  In the clinical setting likely secondary to demand ischemia given the high blood pressures and agitation.  Clinically not in heart failure neither the patient or the daughter endorse chest pain.  Recommend addressing his modifiable cardiovascular risk factors as per primary team and PCP.  Cardiology will sign off.  Please reach out if any questions or concerns arise.  Further recommendations to follow as the case evolves.   This note was created using a voice recognition software as a result there may be grammatical errors inadvertently enclosed that do not reflect the nature of this encounter. Every attempt is made to correct such errors.   Madonna Michele HAS, [facility]  A Division of  [facility] [address], [place], [place] [address]  [place], [place] [address] Pager: [phone] Office: [phone] 03/02/2024 2:56 PM

## 2024-03-02 NOTE — Evaluation (Signed)
 Occupational Therapy Evaluation Patient Details Name: Michael Bailey MRN: [id number] DOB: March 22, 1936 Today's Date: 03/02/2024   History of Present Illness   Pt is a 88 y.o. male admitted from home with worsening dementia and chest pains. EKG showed afib without acute ST changes. Labs negative for UTI, flu, and RSV. Elevated tropinin levels, and hypertensive, likely d/t med noncompliance. PMH: paroxysmal afib, HLD, COPD, dementia.     Clinical Impressions Pt admitted based on above, and was seen based on problem list below. Per daughter, pt was ind with ADLs, but d/t declining cog status required assistance and supervision for IADLs for safety. Today pt is requiring set up  to CGA for  ADLs. Bed mobility was mod I and functional transfers are  CGA with RW. Pt only oriented to self and time, and is only able to follow simple commands for familiar tasks. D/t cog deficits pt would require 24/7 supervision for safety, d/t high fall risk. Pt limited by decreased standing balance, and activity tolerance. Pt would greatly benefit from <3 hours of skilled rehab daily. OT will continue to follow acutely to maximize functional independence.        If plan is discharge home, recommend the following:   A little help with walking and/or transfers;A little help with bathing/dressing/bathroom;Supervision due to cognitive status     Functional Status Assessment   Patient has had a recent decline in their functional status and demonstrates the ability to make significant improvements in function in a reasonable and predictable amount of time.     Equipment Recommendations   Other (comment) (Defer to next venue)       Mobility Bed Mobility Overal bed mobility: Modified Independent             General bed mobility comments: No assist    Transfers Overall transfer level: Needs assistance Equipment used: Rolling walker (2 wheels) Transfers: Sit to/from Stand, Bed to  chair/wheelchair/BSC Sit to Stand: Contact guard assist     Step pivot transfers: Contact guard assist     General transfer comment: Pt using RW to pull up on despite cues, CGA for balance in standing      Balance Overall balance assessment: Needs assistance Sitting-balance support: No upper extremity supported, Feet supported Sitting balance-Leahy Scale: Fair     Standing balance support: No upper extremity supported, During functional activity, Reliant on assistive device for balance Standing balance-Leahy Scale: Poor Standing balance comment: Pt requiring single UE support at all times during functional tasks       ADL either performed or assessed with clinical judgement   ADL Overall ADL's : Needs assistance/impaired Eating/Feeding: Set up;Sitting   Grooming: Wash/dry face;Contact guard assist;Standing Grooming Details (indicate cue type and reason): CGA for balance         Upper Body Dressing : Set up;Sitting   Lower Body Dressing: Contact guard assist;Sit to/from stand Lower Body Dressing Details (indicate cue type and reason): Can figure 4 legs, CGA for standing balance Toilet Transfer: Contact guard assist;Rolling walker (2 wheels);Ambulation Toilet Transfer Details (indicate cue type and reason): Simulated in room Toileting- Clothing Manipulation and Hygiene: Contact guard assist;Sit to/from stand       Functional mobility during ADLs: Contact guard assist;Rolling walker (2 wheels) General ADL Comments: Pt requiring step by step cueing and supervision d/t cog     Vision   Additional Comments: Pt frequently using hands to hold eyes' open, but vision [facility] for tasks assessed  Pertinent Vitals/Pain Pain Assessment Pain Assessment: No/denies pain     Extremity/Trunk Assessment Upper Extremity Assessment Upper Extremity Assessment: Generalized weakness   Lower Extremity Assessment Lower Extremity Assessment: Defer to PT evaluation    Cervical / Trunk Assessment Cervical / Trunk Assessment: Kyphotic   Communication Communication Communication: Impaired Factors Affecting Communication: Hearing impaired   Cognition Arousal: Alert Behavior During Therapy: WFL for tasks assessed/performed Cognition: History of cognitive impairments     OT - Cognition Comments: Pt only oriented to self and time, can follow simple commands for familiar tasks   Following commands: Impaired Following commands impaired: Follows one step commands inconsistently, Follows one step commands with increased time     Cueing  General Comments   Cueing Techniques: Verbal cues;Visual cues  VSS on RA           Home Living Family/patient expects to be discharged to:: Private residence Living Arrangements: Alone Available Help at Discharge: Family;Available PRN/intermittently (daughter check in on daily) Type of Home: House Home Access: Ramped entrance     Home Layout: One level     Bathroom Shower/Tub: Chief Strategy Officer: Standard Bathroom Accessibility: No   Home Equipment: Grab bars - tub/shower;Rolling Walker (2 wheels);Cane - single point   Additional Comments: All home set up achieved from daughter      Prior Functioning/Environment Prior Level of Function : History of Falls (last six months);Independent/Modified Independent             Mobility Comments: Hx of frequent falls, prn use of RW and SPC, uses meals on wheels ADLs Comments: Per daughter ind with ADLs, does not take meds appropriately, has become increased safety hazard with pt living alone    OT Problem List: Decreased range of motion;Impaired balance (sitting and/or standing);Decreased strength;Decreased activity tolerance;Decreased safety awareness;Decreased cognition;Decreased knowledge of use of DME or AE;Cardiopulmonary status limiting activity   OT Treatment/Interventions: Self-care/ADL training;Therapeutic exercise;Energy  conservation;DME and/or AE instruction;Therapeutic activities;Patient/family education;Balance training      OT Goals(Current goals can be found in the care plan section)   Acute Rehab OT Goals Patient Stated Goal: None stated OT Goal Formulation: With patient Time For Goal Achievement: 03/16/24 Potential to Achieve Goals: Good   OT Frequency:  Min 2X/week       AM-PAC OT 6 Clicks Daily Activity     Outcome Measure Help from another person eating meals?: None Help from another person taking care of personal grooming?: A Little Help from another person toileting, which includes using toliet, bedpan, or urinal?: A Little Help from another person bathing (including washing, rinsing, drying)?: A Little Help from another person to put on and taking off regular upper body clothing?: A Little Help from another person to put on and taking off regular lower body clothing?: A Little 6 Click Score: 19   End of Session Equipment Utilized During Treatment: Gait belt;Rolling walker (2 wheels) Nurse Communication: Mobility status  Activity Tolerance: Patient tolerated treatment well Patient left: in bed;with call bell/phone within reach;with bed alarm set  OT Visit Diagnosis: Unsteadiness on feet (R26.81);Other abnormalities of gait and mobility (R26.89);Muscle weakness (generalized) (M62.81);Repeated falls (R29.6);History of falling (Z91.81)                Time: 9144-9078 OT Time Calculation (min): 26 min Charges:  OT General Charges $OT Visit: 1 Visit OT Evaluation $OT Eval Moderate Complexity: 1 Mod OT Treatments $Self Care/Home Management : 8-22 mins  Adrianne BROCKS, OT  Acute Rehabilitation Services Office  [phone] Secure chat preferred   Adrianne GORMAN Savers 03/02/2024, 9:50 AM

## 2024-03-03 ENCOUNTER — Observation Stay (HOSPITAL_COMMUNITY)

## 2024-03-03 DIAGNOSIS — I4891 Unspecified atrial fibrillation: Secondary | ICD-10-CM

## 2024-03-03 DIAGNOSIS — Z7189 Other specified counseling: Secondary | ICD-10-CM | POA: Diagnosis not present

## 2024-03-03 DIAGNOSIS — Z515 Encounter for palliative care: Secondary | ICD-10-CM | POA: Diagnosis not present

## 2024-03-03 DIAGNOSIS — R296 Repeated falls: Secondary | ICD-10-CM | POA: Diagnosis not present

## 2024-03-03 DIAGNOSIS — R9431 Abnormal electrocardiogram [ECG] [EKG]: Secondary | ICD-10-CM | POA: Diagnosis not present

## 2024-03-03 DIAGNOSIS — I10 Essential (primary) hypertension: Secondary | ICD-10-CM | POA: Diagnosis not present

## 2024-03-03 DIAGNOSIS — I160 Hypertensive urgency: Secondary | ICD-10-CM | POA: Diagnosis not present

## 2024-03-03 DIAGNOSIS — F03918 Unspecified dementia, unspecified severity, with other behavioral disturbance: Secondary | ICD-10-CM | POA: Diagnosis not present

## 2024-03-03 LAB — CBC WITH DIFFERENTIAL/PLATELET
Abs Immature Granulocytes: 0.02 K/uL (ref 0.00–0.07)
Basophils Absolute: 0.1 K/uL (ref 0.0–0.1)
Basophils Relative: 1 %
Eosinophils Absolute: 0.2 K/uL (ref 0.0–0.5)
Eosinophils Relative: 3 %
HCT: 51.1 % (ref 39.0–52.0)
Hemoglobin: 16.2 g/dL (ref 13.0–17.0)
Immature Granulocytes: 0 %
Lymphocytes Relative: 26 %
Lymphs Abs: 1.4 K/uL (ref 0.7–4.0)
MCH: 29.4 pg (ref 26.0–34.0)
MCHC: 31.7 g/dL (ref 30.0–36.0)
MCV: 92.7 fL (ref 80.0–100.0)
Monocytes Absolute: 0.5 K/uL (ref 0.1–1.0)
Monocytes Relative: 10 %
Neutro Abs: 3.1 K/uL (ref 1.7–7.7)
Neutrophils Relative %: 60 %
Platelets: 176 K/uL (ref 150–400)
RBC: 5.51 MIL/uL (ref 4.22–5.81)
RDW: 14.1 % (ref 11.5–15.5)
WBC: 5.3 K/uL (ref 4.0–10.5)
nRBC: 0 % (ref 0.0–0.2)

## 2024-03-03 LAB — COMPREHENSIVE METABOLIC PANEL WITH GFR
ALT: 12 U/L (ref 0–44)
AST: 16 U/L (ref 15–41)
Albumin: 3.3 g/dL — ABNORMAL LOW (ref 3.5–5.0)
Alkaline Phosphatase: 55 U/L (ref 38–126)
Anion gap: 12 (ref 5–15)
BUN: 18 mg/dL (ref 8–23)
CO2: 21 mmol/L — ABNORMAL LOW (ref 22–32)
Calcium: 9 mg/dL (ref 8.9–10.3)
Chloride: 104 mmol/L (ref 98–111)
Creatinine, Ser: 1.07 mg/dL (ref 0.61–1.24)
GFR, Estimated: >60 mL/min (ref >60)
Glucose, Bld: 112 mg/dL — ABNORMAL HIGH (ref 70–99)
Potassium: 4.8 mmol/L (ref 3.5–5.1)
Sodium: 137 mmol/L (ref 135–145)
Total Bilirubin: 1.6 mg/dL — ABNORMAL HIGH (ref 0.0–1.2)
Total Protein: 6.5 g/dL (ref 6.5–8.1)

## 2024-03-03 LAB — PHOSPHORUS: Phosphorus: 4 mg/dL (ref 2.5–4.6)

## 2024-03-03 LAB — MAGNESIUM: Magnesium: 2.2 mg/dL (ref 1.7–2.4)

## 2024-03-03 MED ORDER — VITAMIN B-12 1000 MCG PO TABS
1000.0000 ug | ORAL_TABLET | Freq: Every day | ORAL | Status: DC
  Administered 2024-03-03 – 2024-03-07 (×7): 1000 ug via ORAL
  Filled 2024-03-03 (×5): qty 1

## 2024-03-03 NOTE — NC FL2 (Signed)
 [place]  MEDICAID FL2 LEVEL OF CARE FORM     IDENTIFICATION  Patient Name: Michael Bailey Birthdate: 08/04/1935 Sex: male Admission Date (Current Location): 03/01/2024  [facility] and [place] Number:  Producer, television/film/video and Address:  [facility] [place], [address], [place], [place] [address]      Provider Number: [id number]  Attending Physician Name and Address:  Michael Alejandro Donovan, DO  Relative Name and Phone Number:  Michael Bailey (Daughter)  [phone]    Current Level of Care: Hospital Recommended Level of Care: Skilled Nursing Facility Prior Approval Number:    Date Approved/Denied:   PASRR Number: [id number]  Discharge Plan: SNF    Current Diagnoses: Patient Active Problem List   Diagnosis Date Noted   Longstanding persistent atrial fibrillation (HCC) 03/02/2024   Abnormal EKG 03/02/2024   Elevated troponin level not due myocardial infarction 03/02/2024   Noncompliance 03/02/2024   Recurrent falls 03/02/2024   Dementia (HCC) 03/02/2024   Benign hypertension 03/02/2024   Hypertensive urgency 03/01/2024   Moderate dementia without behavioral disturbance, psychotic disturbance, mood disturbance, or anxiety (HCC) 04/28/2022   Hallucination 04/28/2022   Cerebral vascular disease 04/28/2022   Abnormal chest x-ray 04/17/2022   Essential hypertension 04/17/2022   Hardening of the aorta (main artery of the heart) (HCC) 04/17/2022   Ptosis of both upper eyelids 03/23/2022   Nuclear sclerosis, right 01/28/2022   Posterior capsular opacification, left 01/28/2022   Pseudophakia of left eye 01/28/2022    Orientation RESPIRATION BLADDER Height & Weight     Self  Normal Continent Weight: 200 lb 9.9 oz (91 kg) Height:     BEHAVIORAL SYMPTOMS/MOOD NEUROLOGICAL BOWEL NUTRITION STATUS      Continent Diet (See discharge summary)  AMBULATORY STATUS COMMUNICATION OF NEEDS Skin   Limited Assist Verbally Normal                       Personal Care  Assistance Level of Assistance  Bathing, Feeding, Dressing Bathing Assistance: Limited assistance Feeding assistance: Limited assistance Dressing Assistance: Limited assistance     Functional Limitations Info  Sight, Hearing, Speech Sight Info: Impaired Hearing Info: Impaired Speech Info: Adequate    SPECIAL CARE FACTORS FREQUENCY  PT (By licensed PT), OT (By licensed OT), Speech therapy     PT Frequency: 5x week OT Frequency: 5x week     Speech Therapy Frequency: 3x week      Contractures Contractures Info: Not present    Additional Factors Info  Code Status, Allergies, Psychotropic Code Status Info: DNR limited Allergies Info: NKA Psychotropic Info: SEROQUEL          Current Medications (03/03/2024):  This is the current hospital active medication list Current Facility-Administered Medications  Medication Dose Route Frequency Provider Last Rate Last Admin   acetaminophen  (TYLENOL ) tablet 1,000 mg  1,000 mg Oral Q6H PRN Segars, Dorn, MD       albuterol  (PROVENTIL ) (2.5 MG/3ML) 0.083% nebulizer solution 2.5 mg  2.5 mg Nebulization Q4H PRN Segars, Dorn, MD       ALPRAZolam  (XANAX ) tablet 0.25 mg  0.25 mg Oral BID PRN Michael Donnice PARAS, MD   0.25 mg at 03/01/24 1821   amLODipine  (NORVASC ) tablet 10 mg  10 mg Oral Daily Segars, Jonathan, MD   10 mg at 03/03/24 9074   cyanocobalamin  (VITAMIN B12) injection 1,000 mcg  1,000 mcg Subcutaneous Q30 days Michael Donnice PARAS, MD   1,000 mcg at 03/02/24 0846   enoxaparin  (LOVENOX ) injection  40 mg  40 mg Subcutaneous Q24H Segars, Jonathan, MD   40 mg at 03/02/24 2114   haloperidol  lactate (HALDOL ) injection 1 mg  1 mg Intravenous Q6H PRN Segars, Jonathan, MD   1 mg at 03/02/24 0000   hydrALAZINE  (APRESOLINE ) injection 10 mg  10 mg Intravenous Q4H PRN Segars, Jonathan, MD       melatonin tablet 6 mg  6 mg Oral QHS Segars, Jonathan, MD   6 mg at 03/02/24 2114   nitroGLYCERIN  (NITROSTAT ) SL tablet 0.4 mg  0.4 mg Sublingual Q5 min PRN  Michael Longs, MD       ondansetron  (ZOFRAN ) injection 4 mg  4 mg Intravenous Q6H PRN Segars, Dorn, MD       polyethylene glycol (MIRALAX  / GLYCOLAX ) packet 17 g  17 g Oral Daily PRN Segars, Jonathan, MD       QUEtiapine  (SEROQUEL ) tablet 25 mg  25 mg Oral QHS Segars, Jonathan, MD   25 mg at 03/02/24 2114   sodium bicarbonate  tablet 650 mg  650 mg Oral BID Sheikh, Omair Latif, DO   650 mg at 03/03/24 9074   tamsulosin  (FLOMAX ) capsule 0.4 mg  0.4 mg Oral QPC supper Segars, Jonathan, MD   0.4 mg at 03/02/24 1726     Discharge Medications: Please see discharge summary for a list of discharge medications.  Relevant Imaging Results:  Relevant Lab Results:   Additional Information SSN [id number]  Michael Bailey, LCSWA

## 2024-03-03 NOTE — Progress Notes (Signed)
 Speech Language Pathology Treatment:    Patient Details Name: Michael Bailey MRN: [id number] DOB: 28-Oct-1935 Today's Date: 03/03/2024 Time: 9173-9162 SLP Time Calculation (min) (ACUTE ONLY): 11 min   Pt seen for swallow assessment- see evaluation Received order for speech-language-cognitive assessment. During swallow assessment pt's speech intelligible with adequate language. He has history of significant dementia and recommend deferring speech-cognitive assessment at this time and recommend eval if needed at next level of care primarily for family education.    [name]  03/03/2024, 8:49 AM

## 2024-03-03 NOTE — Progress Notes (Signed)
 Physical Therapy Treatment Patient Details Name: Navraj Dreibelbis MRN: [id number] DOB: 1936-03-23 Today's Date: 03/03/2024   History of Present Illness Pt is a 88 y.o. male admitted from home with worsening dementia and chest pains. EKG showed afib without acute ST changes. Labs negative for UTI, flu, and RSV. Elevated tropinin levels, and hypertensive, likely d/t med noncompliance. PMH: paroxysmal afib, HLD, COPD, dementia.    PT Comments  Pt supine in bed on arrival fidgeting in bed.  He is pleasant and agreeable to PT this session.  He continues to require baseline VCs for safety due to underlying dementia.  He required min to mod assistance to mobilize this session.  Pt continues to benefit from rehab in a post acute setting to maximize functional gains before returning home.     If plan is discharge home, recommend the following: A little help with walking and/or transfers;A little help with bathing/dressing/bathroom;Assistance with cooking/housework;Assist for transportation;Help with stairs or ramp for entrance   Can travel by private vehicle        Equipment Recommendations       Recommendations for Other Services       Precautions / Restrictions Precautions Precautions: Fall Restrictions Weight Bearing Restrictions Per Provider Order: No     Mobility  Bed Mobility Overal bed mobility: Needs Assistance Bed Mobility: Supine to Sit, Sit to Supine     Supine to sit: Min assist Sit to supine: Modified independent (Device/Increase time)   General bed mobility comments: Used PTA as a railing to rise into sitting.  Pt able to return back to bed MOD I.    Transfers Overall transfer level: Needs assistance Equipment used: Rolling walker (2 wheels) Transfers: Sit to/from Stand Sit to Stand: Contact guard assist, Min assist           General transfer comment: Intermittent assistance for hand placement to push from seated surface pr reach back for seated surface.  At times  requiring hand over hand to reach and push from seated surface.  Pt required max VCs to back his body and RW entirely to seated surface before sitting.    Ambulation/Gait Ambulation/Gait assistance: Mod assist, Min assist Gait Distance (Feet): 140 Feet Assistive device: Rolling walker (2 wheels) Gait Pattern/deviations: Step-through pattern, Decreased step length - right, Decreased step length - left, Decreased stride length, Staggering right, Scissoring       General Gait Details: Required steady assistance to keep his body closer to device this session.  Pt noted with scissoring turning L out of his room.  As gt progressed stability improved.   Stairs             Wheelchair Mobility     Tilt Bed    Modified Rankin (Stroke Patients Only)       Balance Overall balance assessment: Needs assistance Sitting-balance support: No upper extremity supported, Feet supported Sitting balance-Leahy Scale: Fair       Standing balance-Leahy Scale: Poor Standing balance comment: Pt requiring single UE support at all times during functional tasks, but 2 would be much improved.                            Communication Communication Communication: Impaired Factors Affecting Communication: Hearing impaired  Cognition Arousal: Alert Behavior During Therapy: WFL for tasks assessed/performed   PT - Cognitive impairments: History of cognitive impairments  Following commands: Impaired Following commands impaired: Follows one step commands inconsistently, Follows one step commands with increased time    Cueing Cueing Techniques: Verbal cues, Visual cues  Exercises      General Comments        Pertinent Vitals/Pain Pain Assessment Pain Assessment: No/denies pain    Home Living                          Prior Function            PT Goals (current goals can now be found in the care plan section) Acute Rehab PT  Goals Patient Stated Goal: pt unable Potential to Achieve Goals: Good Progress towards PT goals: Progressing toward goals    Frequency    Min 2X/week      PT Plan      Co-evaluation              AM-PAC PT 6 Clicks Mobility   Outcome Measure  Help needed turning from your back to your side while in a flat bed without using bedrails?: A Little Help needed moving from lying on your back to sitting on the side of a flat bed without using bedrails?: A Little Help needed moving to and from a bed to a chair (including a wheelchair)?: A Little Help needed standing up from a chair using your arms (e.g., wheelchair or bedside chair)?: A Little Help needed to walk in hospital room?: A Lot Help needed climbing 3-5 steps with a railing? : A Lot 6 Click Score: 16    End of Session Equipment Utilized During Treatment: Gait belt Activity Tolerance: Patient tolerated treatment well;Patient limited by fatigue Patient left: in bed;with call bell/phone within reach;with bed alarm set (left in chair position as patient is awaiting a MBS this pm.) Nurse Communication: Mobility status PT Visit Diagnosis: Unsteadiness on feet (R26.81);Other abnormalities of gait and mobility (R26.89);Muscle weakness (generalized) (M62.81)     Time: 8772-8747 PT Time Calculation (min) (ACUTE ONLY): 25 min  Charges:    $Gait Training: 8-22 mins $Therapeutic Activity: 8-22 mins PT General Charges $$ ACUTE PT VISIT: 1 Visit                     Toya HAMS , PTA Acute Rehabilitation Services Office [phone]    Tyrika Newman JINNY Gosling 03/03/2024, 1:12 PM

## 2024-03-03 NOTE — Consult Note (Signed)
 Palliative Care Consult Note                                  Date: 03/03/2024   Patient Name: Michael Bailey  DOB: 1935-10-27  MRN: [id number]  Age / Sex: 88 y.o., male  PCP: Suanne Pfeiffer, NP Referring Physician: Sherrill Alejandro Donovan, DO  Reason for Consultation: Establishing goals of care  HPI/Patient Profile: 88 y.o. male  with past medical history of dementia, paroxysmal atrial fibrillation, COPD, BPH, anxiety, hypertension, hyperlipidemia, and macular degeneration who was admitted on 03/01/2024 with hypertensive urgency.  Notably there is also concern for progressive dementia due to multiple recent falls and behavioral changes.   On further workup, patient was also found to have acute myocardial injury, likely demand ischemia in the setting of uncontrolled hypertension.  Palliative Medicine has been consulted for goals of care discussions. Patient and family are faced with anticipatory care needs and complex medical decision making.   Clinical Assessment and Goals of Care:   Extensive chart review has been completed including labs, vital signs, imaging, progress/consult notes, orders, medications and available advance directive documents.    I met with daughter at bedside to discuss diagnosis, prognosis, GOC, disposition, and options.  I introduced Palliative Medicine as specialized medical care for people living with serious illness. It focuses on providing relief from the symptoms and stress of a serious illness.   Created space and opportunity for daughter to express thoughts and feelings regarding current medical situation. Values and goals of care were attempted to be elicited.  Life Review: Patient is originally from [place] , but lived in many different places over the course of his life.  Child.  Weide is his only living child. His son died in 04/01/2018, followed by his wife who died in 04-02-19 from COVID.   Functional Status: Prior to  admission, patient lived alone at home.  Daughter lives nearby (1/2 mile away) and checked on him daily.  Daughter reports decline in functional and nutritional status over the past 2 months.  She reports issues with wandering, falls, hallucinations, tremors, and forgetting to eat.  Discussion: We discussed patient's current illness and what it means in the larger context of his ongoing co-morbidities. Current clinical status was reviewed.  Detailed discussion was had regarding the natural trajectory of dementia as a progressive and noncurable disease.  We discussed that dementia results in decreased mobility, impaired swallowing, and decreased oral intake.  Daughter states that approximately 6 months ago, patient stopped taking his medications.  He stated that he has lived a good life and is ready to go.  Daughter's main goal is finding a safe disposition for her father.  She states he is no longer safe to live at home.  We recommendation from PT is for SNF/rehab.  Siegel reports she will then pursue LTC placement, and plans to apply for Medicaid on her father's behalf.  A discussion was had today regarding advanced directives. The MOST form was introduced and discussed.We discussed code status and scopes of care. We discussed the difference between full scope versus limited interventions versus comfort care.  We discussed the concept of a comfort path as allowing a natural course to occur, with the focus on comfort and dignity rather than prolonging life.  We discussed the limitations of medical interventions to prolong quality of life when the body fails to thrive.  Daughter is emotional and tearful during our conversation,  but ultimately decides she just wants her father to be comfortable and have the best quality of life for the time he has left.  I discussed with daughter my recommendation to consider the addition of hospice support at the facility when LTC placement is found. I explained  that hospice would be extra care for patient, support for family, and assistance with symptom management and care when he further declines.  For now, Murri agrees to outpatient palliative referral, with option to transition to hospice in the future.   Discussed the importance of continued conversation with the medical team regarding overall plan of care. Questions and concerns addressed.  Emotional support provided.   Review of Systems  Unable to perform ROS   Objective:   Primary Diagnoses: Present on Admission:  Hypertensive urgency   Physical Exam Vitals reviewed.  Constitutional:      General: He is not in acute distress.    Comments: Chronically ill-appearing  Pulmonary:     Effort: Pulmonary effort is normal.  Neurological:     Mental Status: He is confused.  Psychiatric:        Cognition and Memory: Cognition is impaired. Memory is impaired.     Vital Signs:  BP (!) 170/90 (BP Location: Left Arm)   Pulse 66   Temp 97.7 F (36.5 C) (Oral)   Resp 14   Wt 91 kg   SpO2 99%   BMI 25.76 kg/m   Palliative Assessment/Data: PPS 40%     Assessment & Plan:   SUMMARY OF RECOMMENDATIONS   Daughter's main goal is for patient to have a safe disposition She agrees with comfort-focused care Plan for outpatient palliative (while in rehab), with plan to transition to hospice when LTC placement is found PMT will continue to follow  Primary Decision Maker: Next of kin and HCPOA - daughter  Existing Vynca/ACP Documentation: Living will declaring patient's wishes not to have life-prolonging interventions if he is in a persistent vegetative state, unconscious, or has advanced dementia  Code Status/Advance Care Planning: DNR  Symptom Management:  Agree with existing orders: Alprazolam  (Xanax ) 0.25 mg twice daily as needed for anxiety Haldol  1 mg IV every 6 hours as needed for agitation Quetiapine  (Seroquel ) 25 mg  at bedtime  Prognosis:  Difficult to determine,  but less than 6 months would not be surprising  Discharge Planning:  To Be Determined    Discussed with: Dr. Sherrill   Thank you for allowing us  to participate in the care of Jordi Lacko   Time Total: 76 minutes  Detailed review of medical records (labs, imaging, vital signs), medically appropriate exam, discussed with treatment team, counseling and education to patient, family, & staff, documenting clinical information, medication management, coordination of care.   Signed by: Recardo Loll, NP Palliative Medicine Team  Team Phone # [phone]  For individual providers, please see [facility]

## 2024-03-03 NOTE — Progress Notes (Signed)
 PROGRESS NOTE    Michael Bailey  FMW:[id number] DOB: May 31, 1936 DOA: 03/01/2024 PCP: Suanne Pfeiffer, NP   Brief Narrative:  The patient Michael Bailey is an 88 year old Caucasian male with a past medical history significant for but not limited to dementia, proximal atrial fibrillation, essential hypertension, hyperlipidemia, COPD by imaging, BPH, B12 deficiency, anxiety and other comorbidities who was [facility] for [facility] emergency.  Notably there is also concern for worsening dementia as patient lives alone prior to admission and has worsened over the last few months.  Notably that daughter has installed cameras inside and out of his house and he has had multiple falls, wandering behavior and hallucinations with his last fall being the night before admission.  He recently walked for the model and was found in the field and walked outside for rain but unable had to redirect him.  Patient is pleasantly demented so a subjective history is not obtained from him but per his daughter his main complaints were headache and chest discomfort.  Patient's daughter reports that sometimes when she comes out of his house he will place his hand on his chest and in the last 6 months he stopped taking the medications.  He is brought to the ED and found to have behavioral changes and hypertensive urgency/emergency which is improved with medications.  Cardiology was consulted given that A-fib with a slow ventricular response and they recommended holding AV nodal blocking agents and recommended against oral anticoagulation given his underlying dementia, noncompliance of medications and recurrent falls.  Cardiology felt that could also not rule out ischemia given his high blood pressure and agitation but he did not clinically have a heart failure picture and recommended modifiable cardiovascular risk factors and did not recommend ischemic workup.  Palliative care has been consulted for further  goals of care discussion and current plan for the daughter is find a safe disposition for her father and focus on comfort care approach and continuing palliative evaluation while in rehab and then transitioning to hospice once LTC placement is found.   PT/OT evaluating and recommending SNF and TOC consulted for assistance with discharge disposition  Assessment and Plan:  HTN emergency - resolved: Max BP of 201/152, improved with IV hydralazine  and PO Clonidine , Losartan . He has hx of elevated trop, suspect likely elevated in setting of severe range HTN although downtrending. His prior home regimen before stopping meds was Losartan  25 mg, and Metoprolol  25 mg. C/w Hydralazine  10 mg IV q 4 hr prn for SBP > 180  -Changed to Amlodipine  10 mg daily considering issues with med adherence will require least monitoring. Continued home Metoprolol  for HTN/Afib initially however given his A-fib with slow ventricular response this will be discontinued.  Cardiology recommends avoiding AV nodal blocking agents.  Continue monitor blood pressures per protocol.  Last blood pressure reading was a little elevated at 170/90. Will discontinue telemetry monitoring now   Acute myocardial injury: C/o intermittent chest pain, sometimes placing hand over chest per family. EKG without overt ischemic changes. HS trop 66 -> 59. Likely demand in setting of his uncontrolled HTN which is improving. Suspect he has advanced underlying CAD although he is a poor candidate for any procedural intervention due to his issues with medication adherence, discussed this with the daughter.  Cardiology was consulted and recommending against ischemic workup at this time. Management directed at HTN per above.  Discontinue telemetry monitoring   Dementia with worsening behavioral change, wandering behavior  Hx of multiple ground level  falls.  On exam appears atraumatic. CT Head without acute process. Do not feel additional imaging needed at this time.  Trial of Seroquel  25 mg nightly, discussed with daughter in agreement with plan.  Haldol  1 mg IV q 6 hr prn for agitation if not redirectable and risk to self or others  -PT / OT evaluation, TOC for placement as they are recommending SNF -Delirium precautions, fall precautions  -Palliative consulted for further goals of care discussion; U/A Negative  -Patient's daughter met with the Palliative Team and Daughter is very reasonable and realistic. Her main goal is to find a safe disposition for him, but she is very receptive to comfort-focused care and current plan is for outpatient palliative while in rehab and then transition to hospice once LTC placement is found   Paroxysmal A Fib but now with slow ventricular response: Continued home Metoprolol  until patient became bradycardic, no AC in setting of adherence issues and multiple falls.  Cardiology evaluated and recommends avoiding AV nodal blocking agents.  Given that patient is unable to maintain his telemetry and given that he will likely transition to hospice once in LTC will discontinue telemetry monitoring and not check any more labs.  HLD: Not taking cholesterol lowering agents   CKD2 / Metabolic Acidosis: Baseline Cr ~ 1.1, minimal elevation to 1.3 at admission. Check PVR with hx BPH. -BUN/Cr Trend: Recent Labs  Lab 03/01/24 1340 03/02/24 0747 03/03/24 0851  BUN 15 13 18   CREATININE 1.33* 1.18 1.07  -Slight Metabolic Acidosis is improved with a CO2 of 21, anion gap of 12, chloride level of 104.C/w Sodium Bicarbonate  650 mg po BID -Avoid Nephrotoxic Medications, Contrast Dyes, Hypotension and Dehydration to Ensure Adequate Renal Perfusion and will need to Renally Adjust Meds. CTM  and Trend Renal Function carefully and repeat CMP within 1 week  Dysphagia: Patient was coughing w/ Drinking. Obtained SLP evaluation and MBS was done and the patient demonstrated mild oral and moderate pharyngeal dysphagia with decreased lingual control,  decreased laryngeal evaluation and hyoid excursion and reduced tongue base retraction and decreased glottal closure.  Patient was silently aspirating thin liquids and penetrated nectar thick liquids consistently.  The SLP wanted to give him a thickened liquid diet however daughter wanted the patient to have thin liquids and understand the risks of aspiration. C/w D3 thin liquid diet now.   COPD: C/w Nebs prn   BPH: Continue home Tamsulosin  0.4 mg po daily   B12 deficiency: B12 was 158. Continue IM injection of Cyanocobalamin  1000 mcg sq q30 days. Start po Cyanocobalamin  1000 mcg po Daily   Anxiety: Previously taking Xanax  up to 0.5 mg BID. Currently ordered for 0.25 mg BID, Would use sparingly considering his falls.  Hyperbilirubinemia: T Bili is now gone from 1.3 -> 1.6. CTM and Trend and repeat CMP in the AM   Hypoalbuminemia: Patient's Albumin Lvl went from 4.1 -> 3.1 -> 3.3. CTM and Trend and repeat CMP in the AM   DVT prophylaxis: enoxaparin  (LOVENOX ) injection 40 mg Start: 03/01/24 2200    Code Status: Limited: Do not attempt resuscitation (DNR) -DNR-LIMITED -Do Not Intubate/DNI  Family Communication: No family currently at bedside but was updated by the palliative care team  Disposition Plan:  Level of care: Telemetry Cardiac Status is: Observation The patient remains OBS appropriate and will d/c before 2 midnights.   Consultants:  Cardiology Palliative care medicine  Procedures:  As delineated as above  Antimicrobials:  Anti-infectives (From admission, onward)    None  Subjective: Seen and examined at bedside he is still very pleasantly confused and demented and awoken from sleep.  States he slept fairly well today.  When asked about pain he just states okay.  No family at bedside.  No other concerns or complaints at this time.  Objective: Vitals:   03/02/24 2357 03/03/24 0409 03/03/24 0810 03/03/24 1048  BP: (!) 117/95 (!) 139/93 (!) 171/94 (!) 170/90   Pulse: 77 67  66  Resp: 17 18 17 14   Temp: 97.6 F (36.4 C) 97.6 F (36.4 C) 97.6 F (36.4 C) 97.7 F (36.5 C)  TempSrc: Oral Oral Oral Oral  SpO2: 97% 99% 98% 99%  Weight:  91 kg      Intake/Output Summary (Last 24 hours) at 03/03/2024 1606 Last data filed at 03/03/2024 0415 Gross per 24 hour  Intake 177 ml  Output 900 ml  Net -723 ml   Filed Weights   03/02/24 0509 03/03/24 0409  Weight: 92.2 kg 91 kg   Examination: Physical Exam:  Constitutional: Elderly chronically ill-appearing Caucasian male who is hard of hearing and very demented Respiratory: Diminished to auscultation bilaterally, no wheezing, rales, rhonchi or crackles. Normal respiratory effort and patient is not tachypenic. No accessory muscle use.  Unlabored breathing Cardiovascular: Irregularly irregular and on the slow side., no murmurs / rubs / gallops. S1 and S2 auscultated.  Mild extremity edema Abdomen: Soft, non-tender, non-distended. Bowel sounds positive.  GU: Deferred. 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 No induration; Warm and dry.  Neurologic: Very hard of hearing but otherwise CN 2-12 grossly intact with no focal deficits. Romberg sign and cerebellar reflexes not assessed.  Psychiatric: Impaired judgement and Insight. Awake but not fully oriented   Data Reviewed: I have personally reviewed following labs and imaging studies  CBC: Recent Labs  Lab 03/01/24 1340 03/02/24 0747 03/03/24 0851  WBC 6.2 4.7 5.3  NEUTROABS 3.5 2.3 3.1  HGB 15.6 15.5 16.2  HCT 47.7 47.4 51.1  MCV 91.4 90.8 92.7  PLT 173 160 176   Basic Metabolic Panel: Recent Labs  Lab 03/01/24 1340 03/02/24 0747 03/03/24 0851  NA 140 139 137  K 4.0 4.3 4.8  CL 103 108 104  CO2 26 18* 21*  GLUCOSE 89 92 112*  BUN 15 13 18   CREATININE 1.33* 1.18 1.07  CALCIUM 9.6 9.0 9.0  MG  --  2.2 2.2  PHOS  --  4.2 4.0   GFR: CrCl cannot be  calculated (Unknown ideal weight.). Liver Function Tests: Recent Labs  Lab 03/01/24 1340 03/02/24 0747 03/03/24 0851  AST 17 15 16   ALT 8 10 12   ALKPHOS 76 53 55  BILITOT 0.9 1.3* 1.6*  PROT 7.2 6.3* 6.5  ALBUMIN 4.1 3.1* 3.3*   No results for input(s): LIPASE, AMYLASE in the last 168 hours. No results for input(s): AMMONIA in the last 168 hours. Coagulation Profile: No results for input(s): INR, PROTIME in the last 168 hours. Cardiac Enzymes: No results for input(s): CKTOTAL, CKMB, CKMBINDEX, TROPONINI in the last 168 hours. BNP (last 3 results) No results for input(s): PROBNP in the last 8760 hours. HbA1C: No results for input(s): HGBA1C in the last 72 hours. CBG: No results for input(s): GLUCAP in the last 168 hours. Lipid Profile: No results for input(s): CHOL, HDL, LDLCALC, TRIG, CHOLHDL, LDLDIRECT in the last 72 hours. Thyroid Function Tests: Recent Labs    03/02/24 0747  TSH  4.061   Anemia Panel: Recent Labs    03/02/24 0747  VITAMINB12 158*   Sepsis Labs: No results for input(s): PROCALCITON, LATICACIDVEN in the last 168 hours.  Recent Results (from the past 240 hours)  Resp panel by RT-PCR (RSV, Flu A&B, Covid) Anterior Nasal Swab     Status: None   Collection Time: 03/01/24  1:41 PM   Specimen: Anterior Nasal Swab  Result Value Ref Range Status   SARS Coronavirus 2 by RT PCR NEGATIVE NEGATIVE Final    Comment: (NOTE) SARS-CoV-2 target nucleic acids are NOT DETECTED.  The SARS-CoV-2 RNA is generally detectable in upper respiratory specimens during the acute phase of infection. The lowest concentration of SARS-CoV-2 viral copies this assay can detect is 138 copies/mL. A negative result does not preclude SARS-Cov-2 infection and should not be used as the sole basis for treatment or other patient management decisions. A negative result may occur with  improper specimen collection/handling, submission of specimen  other than nasopharyngeal swab, presence of viral mutation(s) within the areas targeted by this assay, and inadequate number of viral copies(<138 copies/mL). A negative result must be combined with clinical observations, patient history, and epidemiological information. The expected result is Negative.  Fact Sheet for Patients:  BloggerCourse.com  Fact Sheet for Healthcare Providers:  SeriousBroker.it  This test is no t yet approved or cleared by the [place]  FDA and  has been authorized for detection and/or diagnosis of SARS-CoV-2 by FDA under an Emergency Use Authorization (EUA). This EUA will remain  in effect (meaning this test can be used) for the duration of the COVID-19 declaration under Section 564(b)(1) of the Act, 21 U.S.C.section 360bbb-3(b)(1), unless the authorization is terminated  or revoked sooner.       Influenza A by PCR NEGATIVE NEGATIVE Final   Influenza B by PCR NEGATIVE NEGATIVE Final    Comment: (NOTE) The Xpert Xpress SARS-CoV-2/FLU/RSV plus assay is intended as an aid in the diagnosis of influenza from Nasopharyngeal swab specimens and should not be used as a sole basis for treatment. Nasal washings and aspirates are unacceptable for Xpert Xpress SARS-CoV-2/FLU/RSV testing.  Fact Sheet for Patients: BloggerCourse.com  Fact Sheet for Healthcare Providers: SeriousBroker.it  This test is not yet approved or cleared by the [place]  FDA and has been authorized for detection and/or diagnosis of SARS-CoV-2 by FDA under an Emergency Use Authorization (EUA). This EUA will remain in effect (meaning this test can be used) for the duration of the COVID-19 declaration under Section 564(b)(1) of the Act, 21 U.S.C. section 360bbb-3(b)(1), unless the authorization is terminated or revoked.     Resp Syncytial Virus by PCR NEGATIVE NEGATIVE Final     Comment: (NOTE) Fact Sheet for Patients: BloggerCourse.com  Fact Sheet for Healthcare Providers: SeriousBroker.it  This test is not yet approved or cleared by the [place]  FDA and has been authorized for detection and/or diagnosis of SARS-CoV-2 by FDA under an Emergency Use Authorization (EUA). This EUA will remain in effect (meaning this test can be used) for the duration of the COVID-19 declaration under Section 564(b)(1) of the Act, 21 U.S.C. section 360bbb-3(b)(1), unless the authorization is terminated or revoked.  Performed at [facility], [address], [place], [place] [address]     Radiology Studies: DG Swallowing [facility] Pathology Result Date: 03/03/2024 Table formatting from the original result was not included. Modified Barium Swallow Study Patient Details Name: Freddi Schrager MRN: [id number] Date of Birth: 07/25/1936 Today's Date: 03/03/2024 HPI/PMH: HPI: Pt  is a 88 y.o. male admitted from home with worsening dementia and chest pains. EKG showed afib without acute ST changes. Labs negative for UTI, flu, and RSV. Elevated tropinin levels, and hypertensive, likely d/t med noncompliance. CT no acute intracranial abnormality, age-related atrophy and moderately advanced cerebral white matter disease. CXR 8/8 No acute findings, right mid and bibasilar linear opacities, likely atelectasis/scarring,  similar to prior study.   bronchitis. PMH: paroxysmal afib, HLD, COPD, dementia.  Interval mild linear atelectasis or scarring in the right mid lung zone, stable mild changes of COPD and chronic bronchitis. Clinical Impression: Clinical Impression: Study limited by pt's excessive movements in and out of view and suspect possible cervical hyperlordosis. He demonstrated mild oral and moderate pharyngeal dysphagia with decreased lingual control, decreased laryngeal elevation and hyoid excursion, reduced tongue base retraction  and decreased glottal closure. Epiglottis was partially inverted and at times no inversion observed. Pt silently aspirated thin liquids, penetrated nectar thick consistently (PAS 3). Verbal cue for volitional cough temporarily cleared vestibule however pt consistently penetrated during sub swallows from vallecular and pyriform sinus residue. Mastication was mildly delayed with solid. No significant esophageal findings during scan. SLP arrived at pt's room after study and pt's daughter and Palliative care present. Results shared and discussed options for po's with explanation, potential consequence of pt's dysphagia. Daughter stated she would like her father to continue with thin liquids accepting risks and Dys 3 texture. Given pt's cognitive impairments, he is unable to perform compensatory strategies. All daughter's questions answered and agreeable for ST to sign off at this time. Factors that may increase risk of adverse event in presence of aspiration Noe & Lianne 2021): Factors that may increase risk of adverse event in presence of aspiration Noe & Lianne 2021): Reduced cognitive function Recommendations/Plan: Swallowing Evaluation Recommendations Swallowing Evaluation Recommendations Recommendations: PO diet PO Diet Recommendation: Dysphagia 3 (Mechanical soft); Thin liquids (Level 0) (dtr accepting aspiration risk) Liquid Administration via: Cup; Straw Medication Administration: Crushed with puree Supervision: Patient able to self-feed; Full supervision/cueing for swallowing strategies Swallowing strategies  : Slow rate; Small bites/sips; Hard cough after swallowing Postural changes: Position pt fully upright for meals Oral care recommendations: Oral care BID (2x/day) Treatment Plan Treatment Plan Treatment recommendations: No treatment recommended at this time (Dtr desires comfort feeds- SLP answered all her questions) Follow-up recommendations: No SLP follow up Functional status assessment: Patient  has not had a recent decline in their functional status. Recommendations Recommendations for follow up therapy are one component of a multi-disciplinary discharge planning process, led by the attending physician.  Recommendations may be updated based on patient status, additional functional criteria and insurance authorization. Assessment: Orofacial Exam: Orofacial Exam Oral Cavity - Dentition: Dentures, top; Dentures, bottom Orofacial Anatomy: WFL Oral Motor/Sensory Function: WFL Anatomy: Anatomy: Other (Comment) (possible hyper-lordosis) Boluses Administered: Boluses Administered Boluses Administered: Thin liquids (Level 0); Mildly thick liquids (Level 2, nectar thick); Moderately thick liquids (Level 3, honey thick); Puree; Solid  Oral Impairment Domain: Oral Impairment Domain Lip Closure: No labial escape Tongue control during bolus hold: Escape to lateral buccal cavity/floor of mouth Bolus preparation/mastication: Slow prolonged chewing/mashing with complete recollection Bolus transport/lingual motion: Delayed initiation of tongue motion (oral holding) Oral residue: Trace residue lining oral structures Location of oral residue : Tongue Initiation of pharyngeal swallow : Pyriform sinuses  Pharyngeal Impairment Domain: Pharyngeal Impairment Domain Soft palate elevation: No bolus between soft palate (SP)/pharyngeal wall (PW) Laryngeal elevation: Partial superior movement of thyroid cartilage/partial approximation of arytenoids to epiglottic  petiole Anterior hyoid excursion: Partial anterior movement Epiglottic movement: No inversion Laryngeal vestibule closure: Incomplete, narrow column air/contrast in laryngeal vestibule Pharyngeal stripping wave : Present - diminished Pharyngeal contraction (A/P view only): N/A Pharyngoesophageal segment opening: Complete distension and complete duration, no obstruction of flow Tongue base retraction: Trace column of contrast or air between tongue base and PPW Pharyngeal  residue: Collection of residue within or on pharyngeal structures Location of pharyngeal residue: Valleculae; Pyriform sinuses  Esophageal Impairment Domain: Esophageal Impairment Domain Esophageal clearance upright position: Complete clearance, esophageal coating Pill: No data recorded Penetration/Aspiration Scale Score: Penetration/Aspiration Scale Score 1.  Material does not enter airway: Solid; Moderately thick liquids (Level 3, honey thick) 3.  Material enters airway, remains ABOVE vocal cords and not ejected out: Mildly thick liquids (Level 2, nectar thick) 8.  Material enters airway, passes BELOW cords without attempt by patient to eject out (silent aspiration) : Thin liquids (Level 0) Compensatory Strategies: Compensatory Strategies Compensatory strategies: Yes Straw: Ineffective Ineffective Straw: Mildly thick liquid (Level 2, nectar thick) Other(comment): Effective (cough) Effective Other(comment): Thin liquid (Level 0); Mildly thick liquid (Level 2, nectar thick)   General Information: No data recorded Diet Prior to this Study: Dysphagia 1 (pureed); Thin liquids (Level 0)   Temperature : Normal   Respiratory Status: WFL   Supplemental O2: None (Room air)   History of Recent Intubation: No  Behavior/Cognition: Alert; Cooperative; Confused; Pleasant mood; Requires cueing; Other (Comment) (very HOH) Self-Feeding Abilities: Able to self-feed Baseline vocal quality/speech: Normal Volitional Cough: Able to elicit Volitional Swallow: Able to elicit Exam Limitations: Excessive movement; Limited visibility Goal Planning: No data recorded No data recorded No data recorded No data recorded Consulted and agree with results and recommendations: Physician; Family member/caregiver; Pt unable/family or caregiver not available; Advanced practice provider; Other (comment) (Palliative care) Pain: Pain Assessment Pain Assessment: No/denies pain Faces Pain Scale: 0 Pain Intervention(s): Monitored during session End of  Session: Start Time:SLP Start Time (ACUTE ONLY): 1329 Stop Time: SLP Stop Time (ACUTE ONLY): 1346 Time Calculation:SLP Time Calculation (min) (ACUTE ONLY): 17 min Charges: SLP Evaluations $ SLP Speech Visit: 1 Visit SLP Evaluations $BSS Swallow: 1 Procedure $MBS Swallow: 1 Procedure SLP visit diagnosis: SLP Visit Diagnosis: Dysphagia, oropharyngeal phase (R13.12) Past Medical History: Past Medical History: Diagnosis Date  Bilateral cataracts   COPD (chronic obstructive pulmonary disease) (HCC)   Hypertension  Past Surgical History: Past Surgical History: Procedure Laterality Date  CATARACT EXTRACTION    HAND SURGERY  1968  work related accident [name] 03/03/2024, 3:07 PM  DG CHEST PORT 1 VIEW Result Date: 03/03/2024 EXAM: 1 VIEW XRAY OF THE CHEST 03/03/2024 09:28:00 AM COMPARISON: 03/01/2024 CLINICAL HISTORY: Dysphagia. FINDINGS: LUNGS AND PLEURA: Right mid and bibasilar linear opacities, likely atelectasis or scarring. No focal pulmonary opacity. No pulmonary edema. No pleural effusion. No pneumothorax. HEART AND MEDIASTINUM: Cardiomediastinal silhouette is similar to prior. BONES AND SOFT TISSUES: No acute osseous abnormality. IMPRESSION: 1. No acute findings. 2. Right mid and bibasilar linear opacities, likely atelectasis/scarring, similar to prior study. Electronically signed by: Limin Xu MD 03/03/2024 10:35 AM EDT RP Workstation: [id number]   Scheduled Meds:  amLODipine   10 mg Oral Daily   vitamin B-12  1,000 mcg Oral Daily   enoxaparin  (LOVENOX ) injection  40 mg Subcutaneous Q24H   melatonin  6 mg Oral QHS   QUEtiapine   25 mg Oral QHS   sodium bicarbonate   650 mg Oral BID   tamsulosin   0.4 mg Oral QPC supper  Continuous Infusions:   LOS: 0 days   Alejandro Marker, DO [facility] Available via Epic secure chat 7am-7pm After these hours, please refer to coverage provider listed on [email or web address] 03/03/2024, 4:06 PM

## 2024-03-03 NOTE — Plan of Care (Signed)
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
## 2024-03-03 NOTE — Evaluation (Signed)
 Clinical/Bedside Swallow Evaluation Patient Details  Name: Michael Bailey MRN: [id number] Date of Birth: Sep 14, 1935  Today's Date: 03/03/2024 Time: SLP Start Time (ACUTE ONLY): 9173 SLP Stop Time (ACUTE ONLY): 0837 SLP Time Calculation (min) (ACUTE ONLY): 11 min  Past Medical History:  Past Medical History:  Diagnosis Date   Bilateral cataracts    COPD (chronic obstructive pulmonary disease) (HCC)    Hypertension    Past Surgical History:  Past Surgical History:  Procedure Laterality Date   CATARACT EXTRACTION     HAND SURGERY  1968   work related accident   HPI:  Pt is a 88 y.o. male admitted from home with worsening dementia and chest pains. EKG showed afib without acute ST changes. Labs negative for UTI, flu, and RSV. Elevated tropinin levels, and hypertensive, likely d/t med noncompliance. CT no acute intracranial abnormality, age-related atrophy and moderately advanced cerebral white matter disease. CXR Interval mild linear atelectasis or scarring in the right mid  lung zone, stable mild changes of COPD and chronic bronchitis.      PMH: paroxysmal afib, HLD, COPD, dementia.  Interval mild linear atelectasis or scarring in the right mid lung zone, stable mild changes of COPD and chronic bronchitis.    Assessment / Plan / Recommendation  Clinical Impression  Pt cooperative, pleasantly confused. He has upper and lower dentures and no oromotor deficits. He is showing s/s aspiration with consistent cough with thin via cup and straw. Oral manipulation and swallow with puree was functional and he masticated solid texture timely and without residue. RN stated daughter reports pt coughs with thin at home and RN stated he coughed with her with thin liquids. Pt would benefit from MBS scheduled for 1:30 today- would hold lunch until then. SLP Visit Diagnosis: Dysphagia, unspecified (R13.10)    Aspiration Risk  Mild aspiration risk    Diet Recommendation Dysphagia 1 (Puree);Thin liquid     Liquid Administration via: Straw;Cup Medication Administration: Whole meds with puree Supervision: Staff to assist with self feeding Compensations: Slow rate;Small sips/bites Postural Changes: Seated upright at 90 degrees    Other  Recommendations Oral Care Recommendations: Oral care BID     Assistance Recommended at Discharge    Functional Status Assessment    Frequency and Duration            Prognosis        Swallow Study   General Date of Onset: 03/03/24 HPI: Pt is a 88 y.o. male admitted from home with worsening dementia and chest pains. EKG showed afib without acute ST changes. Labs negative for UTI, flu, and RSV. Elevated tropinin levels, and hypertensive, likely d/t med noncompliance. CT no acute intracranial abnormality, age-related atrophy and moderately advanced cerebral white matter disease. CXR Interval mild linear atelectasis or scarring in the right mid  lung zone, stable mild changes of COPD and chronic bronchitis.      PMH: paroxysmal afib, HLD, COPD, dementia.  Interval mild linear atelectasis or scarring in the right mid lung zone, stable mild changes of COPD and chronic bronchitis. Type of Study: Bedside Swallow Evaluation Previous Swallow Assessment:  (none) Diet Prior to this Study: Dysphagia 1 (pureed);Thin liquids (Level 0) Temperature Spikes Noted: No Respiratory Status: Room air History of Recent Intubation: No Behavior/Cognition: Alert;Pleasant mood;Cooperative;Confused;Requires cueing Oral Cavity Assessment:  (lingual candidia?) Oral Care Completed by SLP: No Oral Cavity - Dentition: Dentures, top;Dentures, bottom Vision: Functional for self-feeding Self-Feeding Abilities: Able to feed self Patient Positioning: Upright in bed Baseline Vocal Quality: Normal  Volitional Cough: Strong    Oral/Motor/Sensory Function Overall Oral Motor/Sensory Function: Within functional limits   Ice Chips Ice chips: Not tested   Thin Liquid Thin Liquid:  Impaired Presentation: Cup;Straw Pharyngeal  Phase Impairments: Cough - Immediate    Nectar Thick Nectar Thick Liquid: Not tested   Honey Thick Honey Thick Liquid: Not tested   Puree Puree: Within functional limits   Solid     Solid: Within functional limits      Michael Bailey 03/03/2024,8:49 AM

## 2024-03-03 NOTE — Progress Notes (Signed)
 Modified Barium Swallow Study  Patient Details  Name: Michael Bailey MRN: [id number] Date of Birth: May 18, 1936  Today's Date: 03/03/2024  Modified Barium Swallow completed.  Full report located under Chart Review in the Imaging Section.  History of Present Illness Pt is a 88 y.o. male admitted from home with worsening dementia and chest pains. EKG showed afib without acute ST changes. Labs negative for UTI, flu, and RSV. Elevated tropinin levels, and hypertensive, likely d/t med noncompliance. CT no acute intracranial abnormality, age-related atrophy and moderately advanced cerebral white matter disease. CXR 8/8 No acute findings, right mid and bibasilar linear opacities, likely atelectasis/scarring,  similar to prior study.   bronchitis. PMH: paroxysmal afib, HLD, COPD, dementia.  Interval mild linear atelectasis or scarring in the right mid lung zone, stable mild changes of COPD and chronic bronchitis.   Clinical Impression Study limited by pt's excessive movements in and out of view and suspect possible cervical hyperlordosis. He demonstrated mild oral and moderate pharyngeal dysphagia with decreased lingual control, decreased laryngeal elevation and hyoid excursion, reduced tongue base retraction and decreased glottal closure. Epiglottis was partially inverted and at times no inversion observed. Pt silently aspirated thin liquids, penetrated nectar thick consistently (PAS 3). Verbal cue for volitional cough temporarily cleared vestibule however pt consistently penetrated during sub swallows from vallecular and pyriform sinus residue. Mastication was mildly delayed with solid. No significant esophageal findings during scan. SLP arrived at pt's room after study and pt's daughter and Palliative care present. Results shared and discussed options for po's with explanation, potential consequence of pt's dysphagia. Daughter stated she would like her father to continue with thin liquids accepting risks and Dys  3 texture. Given pt's cognitive impairments, he is unable to perform compensatory strategies. All daughter's questions answered and agreeable for ST to sign off at this time. Factors that may increase risk of adverse event in presence of aspiration Noe & Lianne 2021): Reduced cognitive function  Swallow Evaluation Recommendations Recommendations: PO diet PO Diet Recommendation: Dysphagia 3 (Mechanical soft);Thin liquids (Level 0) (dtr accepting aspiration risk) Liquid Administration via: Cup;Straw Medication Administration: Crushed with puree Supervision: Patient able to self-feed;Full supervision/cueing for swallowing strategies Swallowing strategies  : Slow rate;Small bites/sips;Hard cough after swallowing Postural changes: Position pt fully upright for meals Oral care recommendations: Oral care BID (2x/day)      [name] 03/03/2024,3:08 PM

## 2024-03-03 NOTE — Plan of Care (Signed)
  Problem: Education: Goal: Knowledge of General Education information will improve Description: Including pain rating scale, medication(s)/side effects and non-pharmacologic comfort measures Outcome: Progressing   Problem: Health Behavior/Discharge Planning: Goal: Ability to manage health-related needs will improve Outcome: Progressing   Problem: Clinical Measurements: Goal: Ability to maintain clinical measurements within normal limits will improve Outcome: Progressing Goal: Will remain free from infection 03/03/2024 1112 by Gail Cathryne SAILOR, RN Outcome: Progressing 03/03/2024 1112 by Gail Cathryne SAILOR, RN Outcome: Progressing Goal: Diagnostic test results will improve Outcome: Progressing Goal: Respiratory complications will improve Outcome: Progressing Goal: Cardiovascular complication will be avoided Outcome: Progressing   Problem: Activity: Goal: Risk for activity intolerance will decrease Outcome: Progressing   Problem: Nutrition: Goal: Adequate nutrition will be maintained Outcome: Progressing   Problem: Coping: Goal: Level of anxiety will decrease Outcome: Progressing   Problem: Elimination: Goal: Will not experience complications related to bowel motility Outcome: Progressing Goal: Will not experience complications related to urinary retention Outcome: Progressing   Problem: Pain Managment: Goal: General experience of comfort will improve and/or be controlled Outcome: Progressing   Problem: Safety: Goal: Ability to remain free from injury will improve Outcome: Progressing   Problem: Skin Integrity: Goal: Risk for impaired skin integrity will decrease Outcome: Progressing

## 2024-03-04 DIAGNOSIS — F03918 Unspecified dementia, unspecified severity, with other behavioral disturbance: Secondary | ICD-10-CM | POA: Diagnosis not present

## 2024-03-04 DIAGNOSIS — Z515 Encounter for palliative care: Secondary | ICD-10-CM | POA: Diagnosis not present

## 2024-03-04 DIAGNOSIS — I160 Hypertensive urgency: Secondary | ICD-10-CM | POA: Diagnosis not present

## 2024-03-04 DIAGNOSIS — I10 Essential (primary) hypertension: Secondary | ICD-10-CM | POA: Diagnosis not present

## 2024-03-04 DIAGNOSIS — R9431 Abnormal electrocardiogram [ECG] [EKG]: Secondary | ICD-10-CM | POA: Diagnosis not present

## 2024-03-04 DIAGNOSIS — Z7189 Other specified counseling: Secondary | ICD-10-CM | POA: Diagnosis not present

## 2024-03-04 MED ORDER — QUETIAPINE FUMARATE 25 MG PO TABS
25.0000 mg | ORAL_TABLET | Freq: Once | ORAL | Status: AC
  Administered 2024-03-04: 25 mg via ORAL
  Filled 2024-03-04: qty 1

## 2024-03-04 NOTE — Progress Notes (Signed)
 Palliative Medicine Progress Note   Patient Name: Michael Bailey       Date: 03/04/2024 DOB: 01-Nov-1935  Age: 88 y.o. MRN#: [id number] Attending Physician: Sherrill Alejandro Donovan, DO Primary Care Physician: Suanne Pfeiffer, NP Admit Date: 03/01/2024   HPI/Patient Profile: 88 y.o. male  with past medical history of dementia, paroxysmal atrial fibrillation, COPD, BPH, anxiety, hypertension, hyperlipidemia, and macular degeneration who was admitted on 03/01/2024 with hypertensive urgency.  Notably there is also concern for progressive dementia due to multiple recent falls and behavioral changes.   On further workup, patient was also found to have acute myocardial injury, likely demand ischemia in the setting of uncontrolled hypertension.   Palliative Medicine has been consulted for goals of care discussions. Patient and family are faced with anticipatory care needs and complex medical decision making.   Subjective: Chart reviewed. Updates received. Patient assessed. Patient is alert, pleasantly confused. Sitting up in bed eating lunch while interacting with family.   I met with daughter at bedside to complete a MOST form today. We reviewed concepts specific to code status, intubation, antibiotics, IV fluids, artifical feeding, and rehospitalization. We reviewed the difference between full scope versus limited interventions (treat the treatable) versus comfort care.   Daughter has outlined her preferences for the following treatment decisions:  Cardiopulmonary Resuscitation: Do Not Attempt Resuscitation (DNR/No CPR)  Medical Interventions: Comfort Measures: Keep clean, warm, and dry. Use medication by any route, positioning, wound care, and other measures to relieve pain and suffering. Use oxygen, suction and  manual treatment of airway obstruction as needed for comfort. Do not transfer to the hospital unless comfort needs cannot be met in current location.  Antibiotics: No antibiotics (use other measures to relieve symptoms)  IV Fluids: No IV fluids (provide other measures to ensure comfort)  Feeding Tube: No feeding tube     Objective:  Physical Exam Vitals reviewed.  Constitutional:      General: He is not in acute distress.    Comments: Frail, chronically ill-appearing  Pulmonary:     Effort: Pulmonary effort is normal.  Neurological:     Mental Status: He is alert and oriented to person, place, and time.  Psychiatric:        Cognition and Memory: Cognition is impaired. Memory is impaired.  Palliative Medicine Assessment & Plan   Assessment: Principal Problem:   Hypertensive urgency Active Problems:   Longstanding persistent atrial fibrillation (HCC)   Abnormal EKG   Elevated troponin level not due myocardial infarction   Noncompliance   Recurrent falls   Dementia (HCC)   Benign hypertension    Recommendations/Plan: MOST form completed - original placed on chart and copy made to scan into EMR Daughter's main goal is for patient to have a safe disposition Continue comfort-focused care Plan for outpatient palliative (while in rehab), with plan to transition to hospice when LTC placement is found PMT will continue to follow   Code Status: DNR - comfort   Prognosis:  < 6 months  Discharge Planning: Skilled Nursing Facility for rehab with Palliative care service follow-up   Thank you for allowing the Palliative Medicine Team to assist in the care of this patient.   Time: 38 minutes   Recardo KATHEE Loll, NP   Please contact Palliative Medicine Team phone at [phone] for questions and concerns.  For individual providers, please see AMION.

## 2024-03-04 NOTE — Plan of Care (Signed)

## 2024-03-04 NOTE — Progress Notes (Signed)
 PROGRESS NOTE    Michael Bailey  FMW:[id number] DOB: May 06, 1936 DOA: 03/01/2024 PCP: Suanne Pfeiffer, NP   Brief Narrative:  The patient Michael Bailey is an 88 year old Caucasian male with a past medical history significant for but not limited to dementia, proximal atrial fibrillation, essential hypertension, hyperlipidemia, COPD by imaging, BPH, B12 deficiency, anxiety and other comorbidities who [facility] emergency.  Notably there is also concern for worsening dementia as patient lives alone prior to admission and has worsened over the last few months.  Notably that daughter has installed cameras inside and out of his house and he has had multiple falls, wandering behavior and hallucinations with his last fall being the night before admission.  He recently walked for the model and was found in the field and walked outside for rain but unable had to redirect him.  Patient is pleasantly demented so a subjective history is not obtained from him but per his daughter his main complaints were headache and chest discomfort.  Patient's daughter reports that sometimes when she comes out of his house he will place his hand on his chest and in the last 6 months he stopped taking the medications.  He is brought to the ED and found to have behavioral changes and hypertensive urgency/emergency which is improved with medications.  Cardiology was consulted given that A-fib with a slow ventricular response and they recommended holding AV nodal blocking agents and recommended against oral anticoagulation given his underlying dementia, noncompliance of medications and recurrent falls.  Cardiology felt that could also not rule out ischemia given his high blood pressure and agitation but he did not clinically have a heart failure picture and recommended modifiable cardiovascular risk factors and did not recommend ischemic workup.  Palliative care has been consulted for further  goals of care discussion and current plan for the daughter is find a safe disposition for her father and focus on comfort care approach and continuing palliative evaluation while in rehab and then transitioning to hospice once LTC placement is found.   PT/OT evaluating and recommending SNF and TOC consulted for assistance with discharge disposition; Currently has a bed offer for SNF but patient's daughter is deferring decision making until Monday, 03/06/2024 as patient is medically stable for discharge.  Assessment and Plan:  HTN emergency - resolved: Max BP of 201/152, improved with IV hydralazine  and PO Clonidine , Losartan . He has hx of elevated trop, suspect likely elevated in setting of severe range HTN although downtrending. His prior home regimen before stopping meds was Losartan  25 mg, and Metoprolol  25 mg. C/w Hydralazine  10 mg IV q 4 hr prn for SBP > 180  -Changed to Amlodipine  10 mg daily considering issues with med adherence will require least monitoring. Continued home Metoprolol  for HTN/Afib initially however given his A-fib with slow ventricular response this will be discontinued.  Cardiology recommends avoiding AV nodal blocking agents.  Continue monitor blood pressures per protocol.  Last blood pressure reading was a little elevated at 170/90. Will discontinue telemetry monitoring now   Acute myocardial injury: C/o intermittent chest pain, sometimes placing hand over chest per family. EKG without overt ischemic changes. HS trop 66 -> 59. Likely demand in setting of his uncontrolled HTN which is improving. Suspect he has advanced underlying CAD although he is a poor candidate for any procedural intervention due to his issues with medication adherence, discussed this with the daughter.  Cardiology was consulted and recommending against ischemic workup at this time. Management  directed at HTN per above.  Discontinue telemetry monitoring   Dementia with worsening behavioral change, wandering  behavior  Hx of multiple ground level falls.  On exam appears atraumatic. CT Head without acute process. Do not feel additional imaging needed at this time. Trial of Seroquel  25 mg nightly, discussed with daughter in agreement with plan.  Haldol  1 mg IV q 6 hr prn for agitation if not redirectable and risk to self or others  -PT / OT evaluation, TOC for placement as they are recommending SNF -Delirium precautions, fall precautions  -Palliative consulted for further goals of care discussion; U/A Negative  -Patient's daughter met with the Palliative Team and Daughter is very reasonable and realistic. Her main goal is to find a safe disposition for him, but she is very receptive to comfort-focused care and current plan is for outpatient palliative while in rehab and then transition to hospice once LTC placement is found; He is medically stable to D/C to LTC and has a bed offer patient's daughter has deferred decision making until Monday, 03/06/2024   Paroxysmal A Fib but now with slow ventricular response: Continued home Metoprolol  until patient became bradycardic, no AC in setting of adherence issues and multiple falls.  Cardiology evaluated and recommends avoiding AV nodal blocking agents.  Given that patient is unable to maintain his telemetry and given that he will likely transition to hospice once in LTC will discontinue telemetry monitoring and not check any more labs.  HLD: Not taking cholesterol lowering agents   CKD2 / Metabolic Acidosis: Baseline Cr ~ 1.1, minimal elevation to 1.3 at admission. Check PVR with hx BPH. -BUN/Cr Trend: Recent Labs  Lab 03/01/24 1340 03/02/24 0747 03/03/24 0851  BUN 15 13 18   CREATININE 1.33* 1.18 1.07  -Slight Metabolic Acidosis is improved with a CO2 of 21, anion gap of 12, chloride level of 104 on last check.C/w Sodium Bicarbonate  650 mg po BID for now -Avoid Nephrotoxic Medications, Contrast Dyes, Hypotension and Dehydration to Ensure Adequate Renal  Perfusion and will need to Renally Adjust Meds. CTM  and Trend Renal Function carefully and repeat CMP within 1 week  Dysphagia: Patient was coughing w/ Drinking. Obtained SLP evaluation and MBS was done and the patient demonstrated mild oral and moderate pharyngeal dysphagia with decreased lingual control, decreased laryngeal evaluation and hyoid excursion and reduced tongue base retraction and decreased glottal closure.  Patient was silently aspirating thin liquids and penetrated nectar thick liquids consistently.  The SLP wanted to give him a thickened liquid diet however daughter wanted the patient to have thin liquids and understand the risks of aspiration. C/w D3 thin liquid diet now with Aspiration Precautions.   COPD: C/w Nebs prn   BPH: Continue home Tamsulosin  0.4 mg po daily   B12 deficiency: B12 was 158. Continue IM injection of Cyanocobalamin  1000 mcg sq q30 days. Start po Cyanocobalamin  1000 mcg po Daily   Anxiety: Previously taking Xanax  up to 0.5 mg BID. Currently ordered for 0.25 mg BID, Would use sparingly considering his falls.  Hyperbilirubinemia: T Bili is now gone from 1.3 -> 1.6 on last check. CTM and Trend and repeat CMP in the AM   Hypoalbuminemia: Patient's Albumin Lvl went from 4.1 -> 3.1 -> 3.3. CTM and Trend and repeat CMP in the AM   DVT prophylaxis: enoxaparin  (LOVENOX ) injection 40 mg Start: 03/01/24 2200    Code Status: Limited: Do not attempt resuscitation (DNR) -DNR-LIMITED -Do Not Intubate/DNI  Family Communication: No family currently at bedside  Disposition Plan:  Level of care: Telemetry Cardiac Status is: Observation The patient remains OBS appropriate and will d/c before 2 midnights.   Consultants:  Palliative care medicine Cardiology  Procedures:  As delineated as above  Antimicrobials:  Anti-infectives (From admission, onward)    None       Subjective: Seen and examined at bedside resting in bed.  Pleasantly confused and demented but  no issues.  Denies any current pain.  Denies any lightheadedness or dizziness.  No other concerns or complaints at this time.  Objective: Vitals:   03/03/24 1940 03/04/24 0456 03/04/24 0843 03/04/24 1143  BP: 118/82 (!) 141/97 (!) 150/95 (!) 163/97  Pulse: 78 80 75 77  Resp: 18 17 16 20   Temp: 97.8 F (36.6 C) 97.6 F (36.4 C) (!) 97.5 F (36.4 C) (!) 97.4 F (36.3 C)  TempSrc: Oral Oral Oral Oral  SpO2: 98% 97% 93% 94%  Weight:  90.5 kg      Intake/Output Summary (Last 24 hours) at 03/04/2024 1427 Last data filed at 03/04/2024 1100 Gross per 24 hour  Intake 480 ml  Output 850 ml  Net -370 ml   Filed Weights   03/02/24 0509 03/03/24 0409 03/04/24 0456  Weight: 92.2 kg 91 kg 90.5 kg   Examination: Physical Exam:  Constitutional: Elderly chronically ill-appearing Caucasian male who is hard of hearing and pleasantly demented but in no acute distress Respiratory: Diminished to auscultation bilaterally, no wheezing, rales, rhonchi or crackles. Normal respiratory effort and patient is not tachypenic. No accessory muscle use.  Unlabored breathing Cardiovascular: Irregularly irregular, no murmurs / rubs / gallops. S1 and S2 auscultated.  Slight extremity edema Abdomen: Soft, non-tender, non-distended. Bowel sounds positive.  GU: Deferred. 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 No induration; Warm and dry.  Neurologic: Patient extremely hard of hearing but CN 2-12 grossly intact with no focal deficits.  Psychiatric: Is awake and alert but pleasantly demented and confused  Data Reviewed: I have personally reviewed following labs and imaging studies  CBC: Recent Labs  Lab 03/01/24 1340 03/02/24 0747 03/03/24 0851  WBC 6.2 4.7 5.3  NEUTROABS 3.5 2.3 3.1  HGB 15.6 15.5 16.2  HCT 47.7 47.4 51.1  MCV 91.4 90.8 92.7  PLT 173 160 176   Basic Metabolic Panel: Recent Labs  Lab  03/01/24 1340 03/02/24 0747 03/03/24 0851  NA 140 139 137  K 4.0 4.3 4.8  CL 103 108 104  CO2 26 18* 21*  GLUCOSE 89 92 112*  BUN 15 13 18   CREATININE 1.33* 1.18 1.07  CALCIUM 9.6 9.0 9.0  MG  --  2.2 2.2  PHOS  --  4.2 4.0   GFR: CrCl cannot be calculated (Unknown ideal weight.). Liver Function Tests: Recent Labs  Lab 03/01/24 1340 03/02/24 0747 03/03/24 0851  AST 17 15 16   ALT 8 10 12   ALKPHOS 76 53 55  BILITOT 0.9 1.3* 1.6*  PROT 7.2 6.3* 6.5  ALBUMIN 4.1 3.1* 3.3*   No results for input(s): LIPASE, AMYLASE in the last 168 hours. No results for input(s): AMMONIA in the last 168 hours. Coagulation Profile: No results for input(s): INR, PROTIME in the last 168 hours. Cardiac Enzymes: No results for input(s): CKTOTAL, CKMB, CKMBINDEX, TROPONINI in the last 168 hours. BNP (last 3 results) No results for input(s): PROBNP in the last 8760 hours. HbA1C: No results for input(s): HGBA1C in the last  72 hours. CBG: No results for input(s): GLUCAP in the last 168 hours. Lipid Profile: No results for input(s): CHOL, HDL, LDLCALC, TRIG, CHOLHDL, LDLDIRECT in the last 72 hours. Thyroid  Function Tests: Recent Labs    03/02/24 0747  TSH 4.061   Anemia Panel: Recent Labs    03/02/24 0747  VITAMINB12 158*   Sepsis Labs: No results for input(s): PROCALCITON, LATICACIDVEN in the last 168 hours.  Recent Results (from the past 240 hours)  Resp panel by RT-PCR (RSV, Flu A&B, Covid) Anterior Nasal Swab     Status: None   Collection Time: 03/01/24  1:41 PM   Specimen: Anterior Nasal Swab  Result Value Ref Range Status   SARS Coronavirus 2 by RT PCR NEGATIVE NEGATIVE Final    Comment: (NOTE) SARS-CoV-2 target nucleic acids are NOT DETECTED.  The SARS-CoV-2 RNA is generally detectable in upper respiratory specimens during the acute phase of infection. The lowest concentration of SARS-CoV-2 viral copies this assay can detect is 138  copies/mL. A negative result does not preclude SARS-Cov-2 infection and should not be used as the sole basis for treatment or other patient management decisions. A negative result may occur with  improper specimen collection/handling, submission of specimen other than nasopharyngeal swab, presence of viral mutation(s) within the areas targeted by this assay, and inadequate number of viral copies(<138 copies/mL). A negative result must be combined with clinical observations, patient history, and epidemiological information. The expected result is Negative.  Fact Sheet for Patients:  BloggerCourse.com  Fact Sheet for Healthcare Providers:  SeriousBroker.it  This test is no t yet approved or cleared by the [place]  FDA and  has been authorized for detection and/or diagnosis of SARS-CoV-2 by FDA under an Emergency Use Authorization (EUA). This EUA will remain  in effect (meaning this test can be used) for the duration of the COVID-19 declaration under Section 564(b)(1) of the Act, 21 U.S.C.section 360bbb-3(b)(1), unless the authorization is terminated  or revoked sooner.       Influenza A by PCR NEGATIVE NEGATIVE Final   Influenza B by PCR NEGATIVE NEGATIVE Final    Comment: (NOTE) The Xpert Xpress SARS-CoV-2/FLU/RSV plus assay is intended as an aid in the diagnosis of influenza from Nasopharyngeal swab specimens and should not be used as a sole basis for treatment. Nasal washings and aspirates are unacceptable for Xpert Xpress SARS-CoV-2/FLU/RSV testing.  Fact Sheet for Patients: BloggerCourse.com  Fact Sheet for Healthcare Providers: SeriousBroker.it  This test is not yet approved or cleared by the [place]  FDA and has been authorized for detection and/or diagnosis of SARS-CoV-2 by FDA under an Emergency Use Authorization (EUA). This EUA will remain in effect (meaning  this test can be used) for the duration of the COVID-19 declaration under Section 564(b)(1) of the Act, 21 U.S.C. section 360bbb-3(b)(1), unless the authorization is terminated or revoked.     Resp Syncytial Virus by PCR NEGATIVE NEGATIVE Final    Comment: (NOTE) Fact Sheet for Patients: BloggerCourse.com  Fact Sheet for Healthcare Providers: SeriousBroker.it  This test is not yet approved or cleared by the [place]  FDA and has been authorized for detection and/or diagnosis of SARS-CoV-2 by FDA under an Emergency Use Authorization (EUA). This EUA will remain in effect (meaning this test can be used) for the duration of the COVID-19 declaration under Section 564(b)(1) of the Act, 21 U.S.C. section 360bbb-3(b)(1), unless the authorization is terminated or revoked.  Performed at [facility], [address] [place], [place] [address]  Radiology Studies: DG Swallowing Func-Speech Pathology Result Date: 03/03/2024 Table formatting from the original result was not included. Modified Barium Swallow Study Patient Details Name: Michael Bailey MRN: [id number] Date of Birth: Aug 03, 1935 Today's Date: 03/03/2024 HPI/PMH: HPI: Pt is a 88 y.o. male admitted from home with worsening dementia and chest pains. EKG showed afib without acute ST changes. Labs negative for UTI, flu, and RSV. Elevated tropinin levels, and hypertensive, likely d/t med noncompliance. CT no acute intracranial abnormality, age-related atrophy and moderately advanced cerebral white matter disease. CXR 8/8 No acute findings, right mid and bibasilar linear opacities, likely atelectasis/scarring,  similar to prior study.   bronchitis. PMH: paroxysmal afib, HLD, COPD, dementia.  Interval mild linear atelectasis or scarring in the right mid lung zone, stable mild changes of COPD and chronic bronchitis. Clinical Impression: Clinical Impression: Study limited by pt's  excessive movements in and out of view and suspect possible cervical hyperlordosis. He demonstrated mild oral and moderate pharyngeal dysphagia with decreased lingual control, decreased laryngeal elevation and hyoid excursion, reduced tongue base retraction and decreased glottal closure. Epiglottis was partially inverted and at times no inversion observed. Pt silently aspirated thin liquids, penetrated nectar thick consistently (PAS 3). Verbal cue for volitional cough temporarily cleared vestibule however pt consistently penetrated during sub swallows from vallecular and pyriform sinus residue. Mastication was mildly delayed with solid. No significant esophageal findings during scan. SLP arrived at pt's room after study and pt's daughter and Palliative care present. Results shared and discussed options for po's with explanation, potential consequence of pt's dysphagia. Daughter stated she would like her father to continue with thin liquids accepting risks and Dys 3 texture. Given pt's cognitive impairments, he is unable to perform compensatory strategies. All daughter's questions answered and agreeable for ST to sign off at this time. Factors that may increase risk of adverse event in presence of aspiration Noe & Lianne 2021): Factors that may increase risk of adverse event in presence of aspiration Noe & Lianne 2021): Reduced cognitive function Recommendations/Plan: Swallowing Evaluation Recommendations Swallowing Evaluation Recommendations Recommendations: PO diet PO Diet Recommendation: Dysphagia 3 (Mechanical soft); Thin liquids (Level 0) (dtr accepting aspiration risk) Liquid Administration via: Cup; Straw Medication Administration: Crushed with puree Supervision: Patient able to self-feed; Full supervision/cueing for swallowing strategies Swallowing strategies  : Slow rate; Small bites/sips; Hard cough after swallowing Postural changes: Position pt fully upright for meals Oral care recommendations:  Oral care BID (2x/day) Treatment Plan Treatment Plan Treatment recommendations: No treatment recommended at this time (Dtr desires comfort feeds- SLP answered all her questions) Follow-up recommendations: No SLP follow up Functional status assessment: Patient has not had a recent decline in their functional status. Recommendations Recommendations for follow up therapy are one component of a multi-disciplinary discharge planning process, led by the attending physician.  Recommendations may be updated based on patient status, additional functional criteria and insurance authorization. Assessment: Orofacial Exam: Orofacial Exam Oral Cavity - Dentition: Dentures, top; Dentures, bottom Orofacial Anatomy: WFL Oral Motor/Sensory Function: WFL Anatomy: Anatomy: Other (Comment) (possible hyper-lordosis) Boluses Administered: Boluses Administered Boluses Administered: Thin liquids (Level 0); Mildly thick liquids (Level 2, nectar thick); Moderately thick liquids (Level 3, honey thick); Puree; Solid  Oral Impairment Domain: Oral Impairment Domain Lip Closure: No labial escape Tongue control during bolus hold: Escape to lateral buccal cavity/floor of mouth Bolus preparation/mastication: Slow prolonged chewing/mashing with complete recollection Bolus transport/lingual motion: Delayed initiation of tongue motion (oral holding) Oral residue: Trace residue lining oral structures Location of oral residue :  Tongue Initiation of pharyngeal swallow : Pyriform sinuses  Pharyngeal Impairment Domain: Pharyngeal Impairment Domain Soft palate elevation: No bolus between soft palate (SP)/pharyngeal wall (PW) Laryngeal elevation: Partial superior movement of thyroid  cartilage/partial approximation of arytenoids to epiglottic petiole Anterior hyoid excursion: Partial anterior movement Epiglottic movement: No inversion Laryngeal vestibule closure: Incomplete, narrow column air/contrast in laryngeal vestibule Pharyngeal stripping wave : Present -  diminished Pharyngeal contraction (A/P view only): N/A Pharyngoesophageal segment opening: Complete distension and complete duration, no obstruction of flow Tongue base retraction: Trace column of contrast or air between tongue base and PPW Pharyngeal residue: Collection of residue within or on pharyngeal structures Location of pharyngeal residue: Valleculae; Pyriform sinuses  Esophageal Impairment Domain: Esophageal Impairment Domain Esophageal clearance upright position: Complete clearance, esophageal coating Pill: No data recorded Penetration/Aspiration Scale Score: Penetration/Aspiration Scale Score 1.  Material does not enter airway: Solid; Moderately thick liquids (Level 3, honey thick) 3.  Material enters airway, remains ABOVE vocal cords and not ejected out: Mildly thick liquids (Level 2, nectar thick) 8.  Material enters airway, passes BELOW cords without attempt by patient to eject out (silent aspiration) : Thin liquids (Level 0) Compensatory Strategies: Compensatory Strategies Compensatory strategies: Yes Straw: Ineffective Ineffective Straw: Mildly thick liquid (Level 2, nectar thick) Other(comment): Effective (cough) Effective Other(comment): Thin liquid (Level 0); Mildly thick liquid (Level 2, nectar thick)   General Information: No data recorded Diet Prior to this Study: Dysphagia 1 (pureed); Thin liquids (Level 0)   Temperature : Normal   Respiratory Status: WFL   Supplemental O2: None (Room air)   History of Recent Intubation: No  Behavior/Cognition: Alert; Cooperative; Confused; Pleasant mood; Requires cueing; Other (Comment) (very HOH) Self-Feeding Abilities: Able to self-feed Baseline vocal quality/speech: Normal Volitional Cough: Able to elicit Volitional Swallow: Able to elicit Exam Limitations: Excessive movement; Limited visibility Goal Planning: No data recorded No data recorded No data recorded No data recorded Consulted and agree with results and recommendations: Physician; Family  member/caregiver; Pt unable/family or caregiver not available; Advanced practice provider; Other (comment) (Palliative care) Pain: Pain Assessment Pain Assessment: No/denies pain Faces Pain Scale: 0 Pain Intervention(s): Monitored during session End of Session: Start Time:SLP Start Time (ACUTE ONLY): 1329 Stop Time: SLP Stop Time (ACUTE ONLY): 1346 Time Calculation:SLP Time Calculation (min) (ACUTE ONLY): 17 min Charges: SLP Evaluations $ SLP Speech Visit: 1 Visit SLP Evaluations $BSS Swallow: 1 Procedure $MBS Swallow: 1 Procedure SLP visit diagnosis: SLP Visit Diagnosis: Dysphagia, oropharyngeal phase (R13.12) Past Medical History: Past Medical History: Diagnosis Date  Bilateral cataracts   COPD (chronic obstructive pulmonary disease) (HCC)   Hypertension  Past Surgical History: Past Surgical History: Procedure Laterality Date  CATARACT EXTRACTION    HAND SURGERY  1968  work related accident [name] 03/03/2024, 3:07 PM  DG CHEST PORT 1 VIEW Result Date: 03/03/2024 EXAM: 1 VIEW XRAY OF THE CHEST 03/03/2024 09:28:00 AM COMPARISON: 03/01/2024 CLINICAL HISTORY: Dysphagia. FINDINGS: LUNGS AND PLEURA: Right mid and bibasilar linear opacities, likely atelectasis or scarring. No focal pulmonary opacity. No pulmonary edema. No pleural effusion. No pneumothorax. HEART AND MEDIASTINUM: Cardiomediastinal silhouette is similar to prior. BONES AND SOFT TISSUES: No acute osseous abnormality. IMPRESSION: 1. No acute findings. 2. Right mid and bibasilar linear opacities, likely atelectasis/scarring, similar to prior study. Electronically signed by: Limin Xu MD 03/03/2024 10:35 AM EDT RP Workstation: [id number]   Scheduled Meds:  amLODipine   10 mg Oral Daily   vitamin B-12  1,000 mcg Oral Daily   enoxaparin  (LOVENOX ) injection  40 mg Subcutaneous Q24H   melatonin  6 mg Oral QHS   QUEtiapine   25 mg Oral QHS   sodium bicarbonate   650 mg Oral BID   tamsulosin   0.4 mg Oral QPC supper   Continuous Infusions:    LOS: 0 days   Alejandro Marker, DO [facility] Available via Epic secure chat 7am-7pm After these hours, please refer to coverage provider listed on [email or web address] 03/04/2024, 2:27 PM

## 2024-03-05 DIAGNOSIS — F03918 Unspecified dementia, unspecified severity, with other behavioral disturbance: Secondary | ICD-10-CM | POA: Diagnosis not present

## 2024-03-05 DIAGNOSIS — R9431 Abnormal electrocardiogram [ECG] [EKG]: Secondary | ICD-10-CM | POA: Diagnosis not present

## 2024-03-05 DIAGNOSIS — I10 Essential (primary) hypertension: Secondary | ICD-10-CM | POA: Diagnosis not present

## 2024-03-05 DIAGNOSIS — I160 Hypertensive urgency: Secondary | ICD-10-CM | POA: Diagnosis not present

## 2024-03-05 MED ORDER — DICLOFENAC SODIUM 1 % EX GEL
2.0000 g | Freq: Four times a day (QID) | CUTANEOUS | Status: DC
  Administered 2024-03-05 – 2024-03-07 (×11): 2 g via TOPICAL
  Filled 2024-03-05: qty 100

## 2024-03-05 NOTE — Progress Notes (Signed)
 PROGRESS NOTE    Michael Bailey  FMW:[id number] DOB: Dec 24, 1935 DOA: 03/01/2024 PCP: Suanne Pfeiffer, NP   Brief Narrative:  The patient Michael Bailey is an 88 year old Caucasian male with a past medical history significant for but not limited to dementia, proximal atrial fibrillation, essential hypertension, hyperlipidemia, COPD by imaging, BPH, B12 deficiency, anxiety and other comorbidities who [facility].  Notably there is also concern for worsening dementia as patient lives alone prior to admission and has worsened over the last few months.  Notably that daughter has installed cameras inside and out of his house and he has had multiple falls, wandering behavior and hallucinations with his last fall being the night before admission.  He recently walked for the model and was found in the field and walked outside for rain but unable had to redirect him.  Patient is pleasantly demented so a subjective history is not obtained from him but per his daughter his main complaints were headache and chest discomfort.  Patient's daughter reports that sometimes when she comes out of his house he will place his hand on his chest and in the last 6 months he stopped taking the medications.  He is brought to the ED and found to have behavioral changes and hypertensive urgency/emergency which is improved with medications.  Cardiology was consulted given that A-fib with a slow ventricular response and they recommended holding AV nodal blocking agents and recommended against oral anticoagulation given his underlying dementia, noncompliance of medications and recurrent falls.  Cardiology felt that could also not rule out ischemia given his high blood pressure and agitation but he did not clinically have a heart failure picture and recommended modifiable cardiovascular risk factors and did not recommend ischemic workup.  Palliative care has been consulted for further  goals of care discussion and current plan for the daughter is find a safe disposition for her father and focus on comfort care approach and continuing palliative evaluation while in rehab and then transitioning to hospice once LTC placement is found.   PT/OT evaluating and recommending SNF and TOC consulted for assistance with discharge disposition; Currently the patient has a bed offer for SNF but patient's daughter is deferring decision making until Monday, 03/06/2024 to see if he has any more bed offers closer to her home (Requesting [facility] as bed offer is in [place]) as patient is medically stable for discharge at this time.  Assessment and Plan:  HTN emergency - resolved: Max BP of 201/152, improved with IV hydralazine  and PO Clonidine , Losartan . He has hx of elevated trop, suspect likely elevated in setting of severe range HTN although downtrending. His prior home regimen before stopping meds was Losartan  25 mg, and Metoprolol  25 mg. C/w Hydralazine  10 mg IV q 4 hr prn for SBP > 180  -Changed to Amlodipine  10 mg daily considering issues with med adherence will require least monitoring. Continued home Metoprolol  for HTN/Afib initially however given his A-fib with slow ventricular response this will be discontinued.  Cardiology recommends avoiding AV nodal blocking agents.  Continue monitor blood pressures per protocol.  Last blood pressure reading was a little elevated at 141/93. Will discontinue telemetry monitoring now   Acute myocardial injury: C/o intermittent chest pain, sometimes placing hand over chest per family. EKG without overt ischemic changes. HS trop 66 -> 59. Likely demand in setting of his uncontrolled HTN which is improving. Suspect he has advanced underlying CAD although he is a poor candidate for any procedural  intervention due to his issues with medication adherence, discussed this with the daughter.  Cardiology was consulted and recommending against ischemic workup at this  time. Management directed at HTN per above.  Discontinue telemetry monitoring; Continues to have Intermittent CP but daughter thinks it is more related to Anxiety now   Dementia with worsening behavioral change, wandering behavior  Hx of multiple ground level falls.  On exam appears atraumatic. CT Head without acute process. Do not feel additional imaging needed at this time. Trial of Seroquel  25 mg nightly, discussed with daughter in agreement with plan.  Haldol  1 mg IV q 6 hr prn for agitation if not redirectable and risk to self or others  -PT / OT evaluation, TOC for placement as they are recommending SNF -Delirium precautions, fall precautions  -Palliative consulted for further goals of care discussion; U/A Negative  -Patient's daughter met with the Palliative Team and Daughter is very reasonable and realistic. Her main goal is to find a safe disposition for him, but she is very receptive to comfort-focused care and current plan is for outpatient palliative while in rehab and then transition to hospice once LTC placement is found; He is medically stable to D/C to LTC and has a bed offer patient's daughter has deferred decision making until Monday, 03/06/2024   Paroxysmal A Fib but now with slow ventricular response: Continued home Metoprolol  until patient became bradycardic, no AC in setting of adherence issues and multiple falls.  Cardiology evaluated and recommends avoiding AV nodal blocking agents.  Given that patient is unable to maintain his telemetry and given that he will likely transition to hospice once in LTC will discontinue telemetry monitoring and not check any more labs.  HLD: Not taking cholesterol lowering agents   CKD2 / Metabolic Acidosis: Baseline Cr ~ 1.1, minimal elevation to 1.3 at admission. Check PVR with hx BPH. -BUN/Cr Trend: Recent Labs  Lab 03/01/24 1340 03/02/24 0747 03/03/24 0851  BUN 15 13 18   CREATININE 1.33* 1.18 1.07  -Slight Metabolic Acidosis is improved  with a CO2 of 21, anion gap of 12, chloride level of 104 on last check.C/w Sodium Bicarbonate  650 mg po BID for now -Avoid Nephrotoxic Medications, Contrast Dyes, Hypotension and Dehydration to Ensure Adequate Renal Perfusion and will need to Renally Adjust Meds. CTM  and Trend Renal Function carefully and repeat CMP within 1 week  Dysphagia: Patient was coughing w/ Drinking. Obtained SLP evaluation and MBS was done and the patient demonstrated mild oral and moderate pharyngeal dysphagia with decreased lingual control, decreased laryngeal evaluation and hyoid excursion and reduced tongue base retraction and decreased glottal closure.  Patient was silently aspirating thin liquids and penetrated nectar thick liquids consistently.  The SLP wanted to give him a thickened liquid diet however daughter wanted the patient to have thin liquids and understand the risks of aspiration. C/w D3 thin liquid diet now with Aspiration Precautions.   COPD: C/w Nebs prn   Left Knee Pain: Ordered Voltaren  gel; If not improved with this will consider obtaining Knee Imaging.   BPH: Continue home Tamsulosin  0.4 mg po daily   B12 deficiency: B12 was 158. Continue IM injection of Cyanocobalamin  1000 mcg sq q30 days. Start po Cyanocobalamin  1000 mcg po Daily   Anxiety: Previously taking Xanax  up to 0.5 mg BID. Currently ordered for 0.25 mg BID, Would use sparingly considering his falls.  Hyperbilirubinemia: T Bili is now gone from 1.3 -> 1.6 on last check. CTM and Trend and repeat CMP in  the AM   Hypoalbuminemia: Patient's Albumin Lvl went from 4.1 -> 3.1 -> 3.3. CTM and Trend and repeat CMP in the AM   DVT prophylaxis: enoxaparin  (LOVENOX ) injection 40 mg Start: 03/01/24 2200    Code Status: Limited: Do not attempt resuscitation (DNR) -DNR-LIMITED -Do Not Intubate/DNI  Family Communication: Discussed with the patient's daughter at bedside  Disposition Plan:  Level of care: Telemetry Cardiac Status is:  Observation The patient remains OBS appropriate and will d/c before 2 midnights.   Consultants:  Palliative Care Cardiology  Procedures:  As delineated as above  Antimicrobials:  Anti-infectives (From admission, onward)    None       Subjective: Seen and examined at bedside in daughters at bedside today and she had taken the patient up for a stroller in the wheelchair.  Patient was complaining of some left knee pain.  Earlier daughter states that he had some chest discomfort but this resolved with acetaminophen .  Daughter thinks that he continues to have anxiety causing his chest discomfort.  No nausea or vomiting.  Feels okay otherwise.  No other concerns or complaints at this time and daughter wanted to wait until Monday at the [facility] anymore SNF offers for LTC placement.  Objective: Vitals:   03/04/24 2016 03/05/24 0531 03/05/24 0752 03/05/24 1141  BP: (!) 144/95 (!) 140/87 139/84 (!) 141/93  Pulse: 70 86 77 77  Resp: 17 18 18 20   Temp: 98.2 F (36.8 C) 98 F (36.7 C) 98 F (36.7 C) 98 F (36.7 C)  TempSrc: Oral Oral Oral Oral  SpO2: 98% 97% 99% 98%  Weight:  89.4 kg      Intake/Output Summary (Last 24 hours) at 03/05/2024 1553 Last data filed at 03/05/2024 1000 Gross per 24 hour  Intake 720 ml  Output 1200 ml  Net -480 ml   Filed Weights   03/03/24 0409 03/04/24 0456 03/05/24 0531  Weight: 91 kg 90.5 kg 89.4 kg   Examination: Physical Exam:  Constitutional: Elderly chronically ill-appearing Caucasian male who is hard of hearing and pleasantly demented in no acute distress Respiratory: Diminished to auscultation bilaterally, no wheezing, rales, rhonchi or crackles. Normal respiratory effort and patient is not tachypenic. No accessory muscle use.  Unlabored breathing Cardiovascular: RRR, no murmurs / rubs / gallops. S1 and S2 auscultated.  Very mild extremity edema Abdomen: Soft, non-tender, non-distended. Bowel sounds positive.  GU: Deferred. Musculoskeletal: No  clubbing / cyanosis of digits/nails. No joint deformity upper and lower extremities.  Slight pain on palpation on the top of his left knee Skin: No rashes, lesions, ulcers. No induration; Warm and dry.  Neurologic: He is hard of hearing but otherwise cranial nerves II through grossly intact however he is having his eyes taped to keep them open Psychiatric: He is awake and alert but pleasantly demented and intermittently confused  Data Reviewed: I have personally reviewed following labs and imaging studies  CBC: Recent Labs  Lab 03/01/24 1340 03/02/24 0747 03/03/24 0851  WBC 6.2 4.7 5.3  NEUTROABS 3.5 2.3 3.1  HGB 15.6 15.5 16.2  HCT 47.7 47.4 51.1  MCV 91.4 90.8 92.7  PLT 173 160 176   Basic Metabolic Panel: Recent Labs  Lab 03/01/24 1340 03/02/24 0747 03/03/24 0851  NA 140 139 137  K 4.0 4.3 4.8  CL 103 108 104  CO2 26 18* 21*  GLUCOSE 89 92 112*  BUN 15 13 18   CREATININE 1.33* 1.18 1.07  CALCIUM 9.6 9.0 9.0  MG  --  2.2 2.2  PHOS  --  4.2 4.0   GFR: CrCl cannot be calculated (Unknown ideal weight.). Liver Function Tests: Recent Labs  Lab 03/01/24 1340 03/02/24 0747 03/03/24 0851  AST 17 15 16   ALT 8 10 12   ALKPHOS 76 53 55  BILITOT 0.9 1.3* 1.6*  PROT 7.2 6.3* 6.5  ALBUMIN 4.1 3.1* 3.3*   No results for input(s): LIPASE, AMYLASE in the last 168 hours. No results for input(s): AMMONIA in the last 168 hours. Coagulation Profile: No results for input(s): INR, PROTIME in the last 168 hours. Cardiac Enzymes: No results for input(s): CKTOTAL, CKMB, CKMBINDEX, TROPONINI in the last 168 hours. BNP (last 3 results) No results for input(s): PROBNP in the last 8760 hours. HbA1C: No results for input(s): HGBA1C in the last 72 hours. CBG: No results for input(s): GLUCAP in the last 168 hours. Lipid Profile: No results for input(s): CHOL, HDL, LDLCALC, TRIG, CHOLHDL, LDLDIRECT in the last 72 hours. Thyroid  Function Tests: No  results for input(s): TSH, T4TOTAL, FREET4, T3FREE, THYROIDAB in the last 72 hours. Anemia Panel: No results for input(s): VITAMINB12, FOLATE, FERRITIN, TIBC, IRON, RETICCTPCT in the last 72 hours. Sepsis Labs: No results for input(s): PROCALCITON, LATICACIDVEN in the last 168 hours.  Recent Results (from the past 240 hours)  Resp panel by RT-PCR (RSV, Flu A&B, Covid) Anterior Nasal Swab     Status: None   Collection Time: 03/01/24  1:41 PM   Specimen: Anterior Nasal Swab  Result Value Ref Range Status   SARS Coronavirus 2 by RT PCR NEGATIVE NEGATIVE Final    Comment: (NOTE) SARS-CoV-2 target nucleic acids are NOT DETECTED.  The SARS-CoV-2 RNA is generally detectable in upper respiratory specimens during the acute phase of infection. The lowest concentration of SARS-CoV-2 viral copies this assay can detect is 138 copies/mL. A negative result does not preclude SARS-Cov-2 infection and should not be used as the sole basis for treatment or other patient management decisions. A negative result may occur with  improper specimen collection/handling, submission of specimen other than nasopharyngeal swab, presence of viral mutation(s) within the areas targeted by this assay, and inadequate number of viral copies(<138 copies/mL). A negative result must be combined with clinical observations, patient history, and epidemiological information. The expected result is Negative.  Fact Sheet for Patients:  BloggerCourse.com  Fact Sheet for Healthcare Providers:  SeriousBroker.it  This test is no t yet approved or cleared by the [place]  FDA and  has been authorized for detection and/or diagnosis of SARS-CoV-2 by FDA under an Emergency Use Authorization (EUA). This EUA will remain  in effect (meaning this test can be used) for the duration of the COVID-19 declaration under Section 564(b)(1) of the Act,  21 U.S.C.section 360bbb-3(b)(1), unless the authorization is terminated  or revoked sooner.       Influenza A by PCR NEGATIVE NEGATIVE Final   Influenza B by PCR NEGATIVE NEGATIVE Final    Comment: (NOTE) The Xpert Xpress SARS-CoV-2/FLU/RSV plus assay is intended as an aid in the diagnosis of influenza from Nasopharyngeal swab specimens and should not be used as a sole basis for treatment. Nasal washings and aspirates are unacceptable for Xpert Xpress SARS-CoV-2/FLU/RSV testing.  Fact Sheet for Patients: BloggerCourse.com  Fact Sheet for Healthcare Providers: SeriousBroker.it  This test is not yet approved or cleared by the [place]  FDA and has been authorized for detection and/or diagnosis of SARS-CoV-2 by FDA under an Emergency Use Authorization (EUA). This EUA will remain in effect (  meaning this test can be used) for the duration of the COVID-19 declaration under Section 564(b)(1) of the Act, 21 U.S.C. section 360bbb-3(b)(1), unless the authorization is terminated or revoked.     Resp Syncytial Virus by PCR NEGATIVE NEGATIVE Final    Comment: (NOTE) Fact Sheet for Patients: BloggerCourse.com  Fact Sheet for Healthcare Providers: SeriousBroker.it  This test is not yet approved or cleared by the [place]  FDA and has been authorized for detection and/or diagnosis of SARS-CoV-2 by FDA under an Emergency Use Authorization (EUA). This EUA will remain in effect (meaning this test can be used) for the duration of the COVID-19 declaration under Section 564(b)(1) of the Act, 21 U.S.C. section 360bbb-3(b)(1), unless the authorization is terminated or revoked.  Performed at [facility], [address], [place], [place] [address]     Radiology Studies: No results found.  Scheduled Meds:  amLODipine   10 mg Oral Daily   vitamin B-12  1,000 mcg  Oral Daily   diclofenac  Sodium  2 g Topical QID   enoxaparin  (LOVENOX ) injection  40 mg Subcutaneous Q24H   melatonin  6 mg Oral QHS   QUEtiapine   25 mg Oral QHS   sodium bicarbonate   650 mg Oral BID   tamsulosin   0.4 mg Oral QPC supper   Continuous Infusions:   LOS: 0 days   Alejandro Marker, DO [facility] Available via Epic secure chat 7am-7pm After these hours, please refer to coverage provider listed on [email or web address] 03/05/2024, 3:53 PM

## 2024-03-05 NOTE — TOC Progression Note (Signed)
 Transition of Care [facility]) - Progression Note    Patient Details  Name: Michael Bailey MRN: [id number] Date of Birth: February 05, 1936  [facility]) CM/SW Contact  Isaiah Public, LCSWA Phone Number: 03/05/2024, 9:59 AM  Clinical Narrative:     Weekday CSW plans on following up with patients daughter with SNF choice on Monday per her request. TOC will continue to follow and assist with patients dc planning needs.   Expected Discharge Plan: Skilled Nursing Facility Barriers to Discharge: Continued Medical Work up, SNF Pending bed offer, English as a second language teacher               Expected Discharge Plan and Services In-house Referral: Clinical Social Work, Hospice / Palliative Care     Living arrangements for the past 2 months: Single Family Home                                       Social Drivers of Health (SDOH) Interventions SDOH Screenings   Food Insecurity: No Food Insecurity (03/02/2024)  Housing: Low Risk  (03/02/2024)  Transportation Needs: No Transportation Needs (03/01/2024)  Utilities: Not At Risk (03/01/2024)  Financial Resource Strain: Medium Risk (04/30/2023)   Received from [facility]  Physical Activity: Insufficiently Active (04/30/2023)   Received from [facility]  Social Connections: Patient Unable To Answer (03/02/2024)  Stress: Stress Concern Present (04/30/2023)   Received from [facility]  Tobacco Use: Medium Risk (03/01/2024)    Readmission Risk Interventions     No data to display

## 2024-03-05 NOTE — Plan of Care (Signed)
   Problem: Education: Goal: Knowledge of General Education information will improve Description: Including pain rating scale, medication(s)/side effects and non-pharmacologic comfort measures Outcome: Not Progressing

## 2024-03-06 DIAGNOSIS — R9431 Abnormal electrocardiogram [ECG] [EKG]: Secondary | ICD-10-CM | POA: Diagnosis not present

## 2024-03-06 DIAGNOSIS — I160 Hypertensive urgency: Secondary | ICD-10-CM | POA: Diagnosis not present

## 2024-03-06 DIAGNOSIS — I10 Essential (primary) hypertension: Secondary | ICD-10-CM | POA: Diagnosis not present

## 2024-03-06 DIAGNOSIS — F03918 Unspecified dementia, unspecified severity, with other behavioral disturbance: Secondary | ICD-10-CM | POA: Diagnosis not present

## 2024-03-06 NOTE — TOC Progression Note (Addendum)
 Transition of Care [facility]) - Progression Note    Patient Details  Name: Michael Bailey MRN: [id number] Date of Birth: 1935/12/12  [facility]) CM/SW Contact  [name], [name] Phone Number: 03/06/2024, 10:55 AM  Clinical Narrative:   CSW spoke with [name], pts dtr, about current bed offer for SNF. At this time, [name] is the only facility able to extend a bed offer. CSW informed [name]. [name] would like to follow up with CSW after lunch so she can think on her decision.   CSW reached out to [name] with [name] to inquire about bed availability. Awaiting a response.   12:42 PM Per [name], they do have bed availability. [name] called CSW and would like to go with [name] at this time as there are no other bed offers for SNF. CSW notified facility. CSW to start auth once updated PT note is in.   [name] inquired about when she'd hear back about Medicaid application. CSW informed [name] that a medicaid case worker should reach out but CSW will reach out to financial counseling for status update.   3:21 PM [name] with [facility] reached out to CSW to inform that patient is over income to qualify for LTC Medicaid.   3:39 PM [name] called CSW and confirmed that plan for disposition at this time is STR w/ Palliative services following.   4:41 PM Prior auth started fro [facility]. Auth id [id number]. Auth pending at this time.    CSW will continue to follow.   Expected Discharge Plan: Skilled Nursing Facility Barriers to Discharge: Continued Medical Work up, SNF Pending bed offer, English as a second language teacher               Expected Discharge Plan and Services In-house Referral: Clinical Social Work, Hospice / Palliative Care     Living arrangements for the past 2 months: Single Family Home                                       Social Drivers of Health (SDOH) Interventions SDOH Screenings   Food Insecurity: No Food Insecurity (03/02/2024)   Housing: Low Risk  (03/02/2024)  Transportation Needs: No Transportation Needs (03/01/2024)  Utilities: Not At Risk (03/01/2024)  Financial Resource Strain: Medium Risk (04/30/2023)   Received from [facility]  Physical Activity: Insufficiently Active (04/30/2023)   Received from [facility]  Social Connections: Patient Unable To Answer (03/02/2024)  Stress: Stress Concern Present (04/30/2023)   Received from [facility]  Tobacco Use: Medium Risk (03/01/2024)    Readmission Risk Interventions     No data to display

## 2024-03-06 NOTE — Plan of Care (Signed)
 Pt is generally disoriented, oriented to name only, and hard of hearing. Looking for SNF placement, likely leaving on Wednesday. Will ambulate when feeling urinary urgency, not oriented to call bell, and very forgetful.  Velinda Server, RN 03/06/2024 5:26 PM

## 2024-03-06 NOTE — Progress Notes (Signed)
 PROGRESS NOTE    Michael Bailey  FMW:[id number] DOB: 1935/11/04 DOA: 03/01/2024 PCP: Suanne Pfeiffer, NP   Brief Narrative:  The patient Michael Bailey is an 88 year old Caucasian male with a past medical history significant for but not limited to dementia, proximal atrial fibrillation, essential hypertension, hyperlipidemia, COPD by imaging, BPH, B12 deficiency, anxiety and other comorbidities who [facility] emergency.  Notably there is also concern for worsening dementia as patient lives alone prior to admission and has worsened over the last few months.  Notably that daughter has installed cameras inside and out of his house and he has had multiple falls, wandering behavior and hallucinations with his last fall being the night before admission.  He recently walked for the model and was found in the field and walked outside for rain but unable had to redirect him.  Patient is pleasantly demented so a subjective history is not obtained from him but per his daughter his main complaints were headache and chest discomfort.  Patient's daughter reports that sometimes when she comes out of his house he will place his hand on his chest and in the last 6 months he stopped taking the medications.  He is brought to the ED and found to have behavioral changes and hypertensive urgency/emergency which is improved with medications.  Cardiology was consulted given that A-fib with a slow ventricular response and they recommended holding AV nodal blocking agents and recommended against oral anticoagulation given his underlying dementia, noncompliance of medications and recurrent falls.  Cardiology felt that could also not rule out ischemia given his high blood pressure and agitation but he did not clinically have a heart failure picture and recommended modifiable cardiovascular risk factors and did not recommend ischemic workup.  Palliative care has been consulted for further  goals of care discussion and current plan for the daughter is find a safe disposition for her father and focus on comfort care approach and continuing palliative evaluation while in rehab and then transitioning to hospice once LTC placement is found.   PT/OT evaluating and recommending SNF and TOC consulted for assistance with discharge disposition; Currently the patient has been medically stable for discharge and has a bed offer at SNF and needs to be home patient's daughter has excepted this.  The caseworker's discharge insurance authorization once PT note is updated but he remains medically stable at this time.  Assessment and Plan:  HTN emergency - resolved: Max BP of 201/152, improved with IV hydralazine  and PO Clonidine , Losartan . He has hx of elevated trop, suspect likely elevated in setting of severe range HTN although downtrending. His prior home regimen before stopping meds was Losartan  25 mg, and Metoprolol  25 mg. C/w Hydralazine  10 mg IV q 4 hr prn for SBP > 180  -Changed to Amlodipine  10 mg daily considering issues with med adherence will require least monitoring. Continued home Metoprolol  for HTN/Afib initially however given his A-fib with slow ventricular response this will be discontinued.  Cardiology recommends avoiding AV nodal blocking agents.  Continue monitor blood pressures per protocol.  Last blood pressure reading was a little elevated at 140/93. Will discontinue telemetry monitoring now   Acute myocardial injury: C/o intermittent chest pain, sometimes placing hand over chest per family. EKG without overt ischemic changes. HS trop 66 -> 59. Likely demand in setting of his uncontrolled HTN which is improving. Suspect he has advanced underlying CAD although he is a poor candidate for any procedural intervention due to his issues  with medication adherence, discussed this with the daughter.  Cardiology was consulted and recommending against ischemic workup at this time. Management directed  at HTN per above.  Discontinue telemetry monitoring; Continues to have Intermittent CP but daughter thinks it is more related to Anxiety now   Dementia with worsening behavioral change, wandering behavior  Hx of multiple ground level falls.  On exam appears atraumatic. CT Head without acute process. Do not feel additional imaging needed at this time. Trial of Seroquel  25 mg nightly, discussed with daughter in agreement with plan.  Haldol  1 mg IV q 6 hr prn for agitation if not redirectable and risk to self or others  -PT / OT evaluation, TOC for placement as they are recommending SNF -Delirium precautions, fall precautions  -Palliative consulted for further goals of care discussion; U/A Negative  -Patient's daughter met with the Palliative Team and Daughter is very reasonable and realistic. Her main goal is to find a safe disposition for him, but she is very receptive to comfort-focused care and current plan is for outpatient palliative while in rehab and then transition to hospice once LTC placement is found; He is medically stable to D/C to SNF and has a bed offer.  Patient's daughter has excepted the bed at SNF in [place]   Paroxysmal A Fib but now with slow ventricular response: Continued home Metoprolol  until patient became bradycardic, no AC in setting of adherence issues and multiple falls.  Cardiology evaluated and recommends avoiding AV nodal blocking agents.  Given that patient is unable to maintain his telemetry and given that he will likely transition to hospice once in LTC will discontinue telemetry monitoring and not check any more labs.  HLD: Not taking cholesterol lowering agents   CKD2 / Metabolic Acidosis: Baseline Cr ~ 1.1, minimal elevation to 1.3 at admission. Check PVR with hx BPH. -BUN/Cr Trend: Recent Labs  Lab 03/01/24 1340 03/02/24 0747 03/03/24 0851  BUN 15 13 18   CREATININE 1.33* 1.18 1.07  -Slight Metabolic Acidosis is improved with a CO2 of 21, anion gap of 12,  chloride level of 104 on last check.C/w Sodium Bicarbonate  650 mg po BID for now -Avoid Nephrotoxic Medications, Contrast Dyes, Hypotension and Dehydration to Ensure Adequate Renal Perfusion and will need to Renally Adjust Meds. CTM  and Trend Renal Function carefully and repeat CMP within 1 week  Dysphagia: Patient was coughing w/ Drinking. Obtained SLP evaluation and MBS was done and the patient demonstrated mild oral and moderate pharyngeal dysphagia with decreased lingual control, decreased laryngeal evaluation and hyoid excursion and reduced tongue base retraction and decreased glottal closure.  Patient was silently aspirating thin liquids and penetrated nectar thick liquids consistently.  The SLP wanted to give him a thickened liquid diet however daughter wanted the patient to have thin liquids and understand the risks of aspiration. C/w D3 thin liquid diet now with Aspiration Precautions.   COPD: C/w Nebs prn   Left Knee Pain: Ordered Voltaren  gel; If not improved with this will consider obtaining Knee Imaging.   BPH: Continue home Tamsulosin  0.4 mg po daily   B12 deficiency: B12 was 158. Continue IM injection of Cyanocobalamin  1000 mcg sq q30 days. Start po Cyanocobalamin  1000 mcg po Daily   Anxiety: Previously taking Xanax  up to 0.5 mg BID. Currently ordered for 0.25 mg BID, Would use sparingly considering his falls.  Hyperbilirubinemia: T Bili is now gone from 1.3 -> 1.6 on last check. CTM and Trend and repeat CMP in the AM  Hypoalbuminemia: Patient's Albumin Lvl went from 4.1 -> 3.1 -> 3.3. CTM and Trend and repeat CMP in the AM   DVT prophylaxis: enoxaparin  (LOVENOX ) injection 40 mg Start: 03/01/24 2200    Code Status: Limited: Do not attempt resuscitation (DNR) -DNR-LIMITED -Do Not Intubate/DNI  Family Communication: No family currently at bedside  Disposition Plan:  Level of care: Telemetry Cardiac Status is: Observation The patient remains OBS appropriate and will d/c  before 2 midnights.   Consultants:  Cardiology Palliative Care Medicine   Procedures:  As delineated as above   Antimicrobials:  Anti-infectives (From admission, onward)    None       Subjective: Seen and examined at bedside and he is doing fairly well but pleasantly confused and demented still.  Had no issues and denied any current pain.  No other concerns or complaints at this time and patient's daughter will be accepting the bed at SNF in [place] and insurance authorization just to start once physical therapy reevaluates the patient.  Objective: Vitals:   03/06/24 1035 03/06/24 1108 03/06/24 1400 03/06/24 1611  BP: 119/61 (!) 130/92  (!) 140/93  Pulse:  63  63  Resp:  19  20  Temp:  (!) 97.5 F (36.4 C)  97.8 F (36.6 C)  TempSrc:  Axillary  Oral  SpO2:  99%  99%  Weight:   88.5 kg   Height:   6' 2 (1.88 m)     Intake/Output Summary (Last 24 hours) at 03/06/2024 1638 Last data filed at 03/06/2024 1253 Gross per 24 hour  Intake 720 ml  Output 855 ml  Net -135 ml   Filed Weights   03/05/24 0531 03/06/24 0340 03/06/24 1400  Weight: 89.4 kg 88.5 kg 88.5 kg   Examination: Physical Exam:  Constitutional: Elderly chronically ill-appearing Caucasian male who is hard of hearing pleasantly demented no acute distress Respiratory: Diminished to auscultation bilaterally, no wheezing, rales, rhonchi or crackles. Normal respiratory effort and patient is not tachypenic. No accessory muscle use.  Unlabored breathing Cardiovascular: RRR, no murmurs / rubs / gallops. S1 and S2 auscultated.  Slight extremity edema Abdomen: Soft, non-tender, non-distended. Bowel sounds positive.  GU: Deferred. Musculoskeletal: No clubbing / cyanosis of digits/nails. No joint deformity upper and lower extremities.  Skin: No rashes, lesions, ulcers on limited skin evaluation. No induration; Warm and dry.  Neurologic: He is extremely hard of hearing but otherwise CN 2-12 grossly intact with no  focal deficits except his eyelid droopiness. Romberg sign and cerebellar reflexes not assessed.  Psychiatric: Is awake and alert but pleasantly demented and confused  Data Reviewed: I have personally reviewed following labs and imaging studies  CBC: Recent Labs  Lab 03/01/24 1340 03/02/24 0747 03/03/24 0851  WBC 6.2 4.7 5.3  NEUTROABS 3.5 2.3 3.1  HGB 15.6 15.5 16.2  HCT 47.7 47.4 51.1  MCV 91.4 90.8 92.7  PLT 173 160 176   Basic Metabolic Panel: Recent Labs  Lab 03/01/24 1340 03/02/24 0747 03/03/24 0851  NA 140 139 137  K 4.0 4.3 4.8  CL 103 108 104  CO2 26 18* 21*  GLUCOSE 89 92 112*  BUN 15 13 18   CREATININE 1.33* 1.18 1.07  CALCIUM 9.6 9.0 9.0  MG  --  2.2 2.2  PHOS  --  4.2 4.0   GFR: Estimated Creatinine Clearance: 58.7 mL/min (by C-G formula based on SCr of 1.07 mg/dL). Liver Function Tests: Recent Labs  Lab 03/01/24 1340 03/02/24 0747 03/03/24 0851  AST 17  15 16  ALT 8 10 12   ALKPHOS 76 53 55  BILITOT 0.9 1.3* 1.6*  PROT 7.2 6.3* 6.5  ALBUMIN 4.1 3.1* 3.3*   No results for input(s): LIPASE, AMYLASE in the last 168 hours. No results for input(s): AMMONIA in the last 168 hours. Coagulation Profile: No results for input(s): INR, PROTIME in the last 168 hours. Cardiac Enzymes: No results for input(s): CKTOTAL, CKMB, CKMBINDEX, TROPONINI in the last 168 hours. BNP (last 3 results) No results for input(s): PROBNP in the last 8760 hours. HbA1C: No results for input(s): HGBA1C in the last 72 hours. CBG: No results for input(s): GLUCAP in the last 168 hours. Lipid Profile: No results for input(s): CHOL, HDL, LDLCALC, TRIG, CHOLHDL, LDLDIRECT in the last 72 hours. Thyroid  Function Tests: No results for input(s): TSH, T4TOTAL, FREET4, T3FREE, THYROIDAB in the last 72 hours. Anemia Panel: No results for input(s): VITAMINB12, FOLATE, FERRITIN, TIBC, IRON, RETICCTPCT in the last 72 hours. Sepsis  Labs: No results for input(s): PROCALCITON, LATICACIDVEN in the last 168 hours.  Recent Results (from the past 240 hours)  Resp panel by RT-PCR (RSV, Flu A&B, Covid) Anterior Nasal Swab     Status: None   Collection Time: 03/01/24  1:41 PM   Specimen: Anterior Nasal Swab  Result Value Ref Range Status   SARS Coronavirus 2 by RT PCR NEGATIVE NEGATIVE Final    Comment: (NOTE) SARS-CoV-2 target nucleic acids are NOT DETECTED.  The SARS-CoV-2 RNA is generally detectable in upper respiratory specimens during the acute phase of infection. The lowest concentration of SARS-CoV-2 viral copies this assay can detect is 138 copies/mL. A negative result does not preclude SARS-Cov-2 infection and should not be used as the sole basis for treatment or other patient management decisions. A negative result may occur with  improper specimen collection/handling, submission of specimen other than nasopharyngeal swab, presence of viral mutation(s) within the areas targeted by this assay, and inadequate number of viral copies(<138 copies/mL). A negative result must be combined with clinical observations, patient history, and epidemiological information. The expected result is Negative.  Fact Sheet for Patients:  BloggerCourse.com  Fact Sheet for Healthcare Providers:  SeriousBroker.it  This test is no t yet approved or cleared by the [place]  FDA and  has been authorized for detection and/or diagnosis of SARS-CoV-2 by FDA under an Emergency Use Authorization (EUA). This EUA will remain  in effect (meaning this test can be used) for the duration of the COVID-19 declaration under Section 564(b)(1) of the Act, 21 U.S.C.section 360bbb-3(b)(1), unless the authorization is terminated  or revoked sooner.       Influenza A by PCR NEGATIVE NEGATIVE Final   Influenza B by PCR NEGATIVE NEGATIVE Final    Comment: (NOTE) The Xpert Xpress  SARS-CoV-2/FLU/RSV plus assay is intended as an aid in the diagnosis of influenza from Nasopharyngeal swab specimens and should not be used as a sole basis for treatment. Nasal washings and aspirates are unacceptable for Xpert Xpress SARS-CoV-2/FLU/RSV testing.  Fact Sheet for Patients: BloggerCourse.com  Fact Sheet for Healthcare Providers: SeriousBroker.it  This test is not yet approved or cleared by the [place]  FDA and has been authorized for detection and/or diagnosis of SARS-CoV-2 by FDA under an Emergency Use Authorization (EUA). This EUA will remain in effect (meaning this test can be used) for the duration of the COVID-19 declaration under Section 564(b)(1) of the Act, 21 U.S.C. section 360bbb-3(b)(1), unless the authorization is terminated or revoked.     Resp  Syncytial Virus by PCR NEGATIVE NEGATIVE Final    Comment: (NOTE) Fact Sheet for Patients: BloggerCourse.com  Fact Sheet for Healthcare Providers: SeriousBroker.it  This test is not yet approved or cleared by the [place]  FDA and has been authorized for detection and/or diagnosis of SARS-CoV-2 by FDA under an Emergency Use Authorization (EUA). This EUA will remain in effect (meaning this test can be used) for the duration of the COVID-19 declaration under Section 564(b)(1) of the Act, 21 U.S.C. section 360bbb-3(b)(1), unless the authorization is terminated or revoked.  Performed at [facility], [address] [place], [place] [address]     Radiology Studies: No results found.  Scheduled Meds:  amLODipine   10 mg Oral Daily   vitamin B-12  1,000 mcg Oral Daily   diclofenac  Sodium  2 g Topical QID   enoxaparin  (LOVENOX ) injection  40 mg Subcutaneous Q24H   melatonin  6 mg Oral QHS   QUEtiapine   25 mg Oral QHS   sodium bicarbonate   650 mg Oral BID   tamsulosin   0.4 mg Oral  QPC supper   Continuous Infusions:   LOS: 0 days   Alejandro Marker, DO [facility] Available via Epic secure chat 7am-7pm After these hours, please refer to coverage provider listed on [email or web address] 03/06/2024, 4:38 PM

## 2024-03-06 NOTE — Progress Notes (Signed)
 Physical Therapy Treatment Patient Details Name: Michael Bailey MRN: [id number] DOB: [birth year] Today's Date: 03/06/2024   History of Present Illness Pt is a 88 y.o. male admitted from home with worsening dementia and chest pains. EKG showed afib without acute ST changes. Labs negative for UTI, flu, and RSV. Elevated tropinin levels, and hypertensive, likely d/t med noncompliance. PMH: paroxysmal afib, HLD, COPD, dementia.    PT Comments  Pt agreeable to session, had just gotten OOB to bathroom without assistance according to RN upon PT arrival. The pt continues to need up to modA to steady when standing and ambulating, as well as frequent cues for use of RW, positioning in RW, and to manage both RW and pt balance with turning. The pt demos poor awareness of balance deficits, function of RW, and need for assistance. Continue to recommend post-acute therapies <3hours/day after d/c to facilitate return to maximal level of independence and safety with mobility.     If plan is discharge home, recommend the following: A little help with walking and/or transfers;A little help with bathing/dressing/bathroom;Assistance with cooking/housework;Assist for transportation;Help with stairs or ramp for entrance   Can travel by private vehicle     Yes  Equipment Recommendations  Other (comment) (defer to post acute)    Recommendations for Other Services       Precautions / Restrictions Precautions Precautions: Fall Recall of Precautions/Restrictions: Impaired Restrictions Weight Bearing Restrictions Per Provider Order: No     Mobility  Bed Mobility Overal bed mobility: Modified Independent             General bed mobility comments: successfully got out of bed without assistance prior to arrival of PT    Transfers Overall transfer level: Needs assistance Equipment used: Rolling walker (2 wheels) Transfers: Sit to/from Stand, Bed to chair/wheelchair/BSC Sit to Stand: Contact guard assist    Step pivot transfers: Contact guard assist       General transfer comment: cues for hand placement and steadying of RW.    Ambulation/Gait Ambulation/Gait assistance: Mod assist, Min assist Gait Distance (Feet): 125 Feet Assistive device: Rolling walker (2 wheels) Gait Pattern/deviations: Step-through pattern, Decreased step length - right, Decreased step length - left, Decreased stride length, Staggering right, Scissoring (hip drop more L than R due to pelvic/hip weakness.) Gait velocity: decreased Gait velocity interpretation: <1.31 ft/sec, indicative of household ambulator   General Gait Details: steadying assist to RW and pt, increased cues to maintain proximity to RW and trunk upright. significant difficulty managing RW and balance with turning   Stairs             Wheelchair Mobility     Tilt Bed    Modified Rankin (Stroke Patients Only)       Balance Overall balance assessment: Needs assistance Sitting-balance support: No upper extremity supported, Feet supported Sitting balance-Leahy Scale: Fair     Standing balance support: No upper extremity supported, During functional activity, Reliant on assistive device for balance, Single extremity supported Standing balance-Leahy Scale: Poor Standing balance comment: Pt requiring single UE support at all times during functional tasks, but 2 would be much improved.                            Communication Communication Communication: Impaired Factors Affecting Communication: Hearing impaired  Cognition Arousal: Alert Behavior During Therapy: WFL for tasks assessed/performed   PT - Cognitive impairments: History of cognitive impairments, Orientation, Awareness, Memory, Attention, Problem solving, Safety/Judgement  Orientation impairments: Time, Situation                   PT - Cognition Comments: hx of dementia, able to follow simple commands but needs repeated cues Following commands:  Impaired Following commands impaired: Follows one step commands inconsistently, Follows one step commands with increased time    Cueing Cueing Techniques: Verbal cues, Visual cues  Exercises      General Comments General comments (skin integrity, edema, etc.): VSS on RA      Pertinent Vitals/Pain Pain Assessment Pain Assessment: No/denies pain Faces Pain Scale: No hurt Pain Intervention(s): Monitored during session     PT Goals (current goals can now be found in the care plan section) Acute Rehab PT Goals Patient Stated Goal: pt unable PT Goal Formulation: Patient unable to participate in goal setting Time For Goal Achievement: 03/16/24 Potential to Achieve Goals: Good Progress towards PT goals: Progressing toward goals    Frequency    Min 2X/week       AM-PAC PT 6 Clicks Mobility   Outcome Measure  Help needed turning from your back to your side while in a flat bed without using bedrails?: A Little Help needed moving from lying on your back to sitting on the side of a flat bed without using bedrails?: A Little Help needed moving to and from a bed to a chair (including a wheelchair)?: A Little Help needed standing up from a chair using your arms (e.g., wheelchair or bedside chair)?: A Little Help needed to walk in hospital room?: A Lot Help needed climbing 3-5 steps with a railing? : A Lot 6 Click Score: 16    End of Session Equipment Utilized During Treatment: Gait belt Activity Tolerance: Patient tolerated treatment well Patient left: with call bell/phone within reach;in chair;with chair alarm set Nurse Communication: Mobility status PT Visit Diagnosis: Unsteadiness on feet (R26.81);Other abnormalities of gait and mobility (R26.89);Muscle weakness (generalized) (M62.81)     Time: 8558-8495 PT Time Calculation (min) (ACUTE ONLY): 23 min  Charges:    $Gait Training: 8-22 mins $Therapeutic Exercise: 8-22 mins PT General Charges $$ ACUTE PT VISIT: 1  Visit                     Izetta Call, PT, DPT   Acute Rehabilitation Department Office [phone] Secure Chat Communication Preferred   Izetta JULIANNA Call 03/06/2024, 4:32 PM

## 2024-03-07 DIAGNOSIS — Z515 Encounter for palliative care: Secondary | ICD-10-CM | POA: Diagnosis not present

## 2024-03-07 DIAGNOSIS — R9431 Abnormal electrocardiogram [ECG] [EKG]: Secondary | ICD-10-CM | POA: Diagnosis not present

## 2024-03-07 DIAGNOSIS — Z7189 Other specified counseling: Secondary | ICD-10-CM | POA: Diagnosis not present

## 2024-03-07 DIAGNOSIS — I10 Essential (primary) hypertension: Secondary | ICD-10-CM | POA: Diagnosis not present

## 2024-03-07 DIAGNOSIS — F03918 Unspecified dementia, unspecified severity, with other behavioral disturbance: Secondary | ICD-10-CM | POA: Diagnosis not present

## 2024-03-07 DIAGNOSIS — I160 Hypertensive urgency: Secondary | ICD-10-CM | POA: Diagnosis not present

## 2024-03-07 MED ORDER — ALPRAZOLAM 0.25 MG PO TABS: 0.2500 mg | ORAL_TABLET | Freq: Two times a day (BID) | ORAL | 0 refills | Status: AC | PRN

## 2024-03-07 MED ORDER — ACETAMINOPHEN 500 MG PO TABS: 1000.0000 mg | ORAL_TABLET | Freq: Four times a day (QID) | ORAL | Status: AC | PRN

## 2024-03-07 MED ORDER — CYANOCOBALAMIN 1000 MCG PO TABS
1000.0000 ug | ORAL_TABLET | Freq: Every day | ORAL | Status: AC
Start: 2024-03-08 — End: ?

## 2024-03-07 MED ORDER — NITROGLYCERIN 0.4 MG SL SUBL
0.4000 mg | SUBLINGUAL_TABLET | SUBLINGUAL | Status: AC | PRN
Start: 2024-03-07 — End: ?

## 2024-03-07 MED ORDER — BISACODYL 10 MG RE SUPP: 10.0000 mg | Freq: Every day | RECTAL | Status: DC | PRN

## 2024-03-07 MED ORDER — DICLOFENAC SODIUM 1 % EX GEL: 2.0000 g | Freq: Four times a day (QID) | CUTANEOUS | Status: AC

## 2024-03-07 MED ORDER — MELATONIN 3 MG PO TABS: 6.0000 mg | ORAL_TABLET | Freq: Every day | ORAL | Status: AC

## 2024-03-07 MED ORDER — AMLODIPINE BESYLATE 10 MG PO TABS: 10.0000 mg | ORAL_TABLET | Freq: Every day | ORAL | Status: AC

## 2024-03-07 MED ORDER — QUETIAPINE FUMARATE 25 MG PO TABS: 25.0000 mg | ORAL_TABLET | Freq: Every day | ORAL | Status: AC

## 2024-03-07 MED ORDER — ALBUTEROL SULFATE (2.5 MG/3ML) 0.083% IN NEBU: 2.5000 mg | INHALATION_SOLUTION | RESPIRATORY_TRACT | Status: AC | PRN

## 2024-03-07 MED ORDER — POLYETHYLENE GLYCOL 3350 17 G PO PACK: 17.0000 g | PACK | Freq: Every day | ORAL | Status: AC | PRN

## 2024-03-07 MED ORDER — TAMSULOSIN HCL 0.4 MG PO CAPS: 0.4000 mg | ORAL_CAPSULE | Freq: Every day | ORAL | Status: AC

## 2024-03-07 NOTE — Progress Notes (Signed)
 Palliative Medicine Progress Note   Patient Name: Michael Bailey       Date: 03/07/2024 DOB: [birth year]  Age: 88 y.o. MRN#: [id number] Attending Physician: Sherrill Alejandro Donovan, DO Primary Care Physician: Suanne Pfeiffer, NP Admit Date: 03/01/2024   HPI/Patient Profile: 88 y.o. male  with past medical history of dementia, paroxysmal atrial fibrillation, COPD, BPH, anxiety, hypertension, hyperlipidemia, and macular degeneration who was admitted on 03/01/2024 with hypertensive urgency.  Notably there is also concern for progressive dementia due to multiple recent falls and behavioral changes.   On further workup, patient was also found to have acute myocardial injury, likely demand ischemia in the setting of uncontrolled hypertension.   Palliative Medicine has been consulted for goals of care discussions. Patient and family are faced with anticipatory care needs and complex medical decision making.  Subjective: Chart reviewed. Update received. Patient assessed. He is sitting up in the recliner, calm, has no acute complaints. I wished him a happy birthday.  I spoke with daughter/Paula by phone. She had questions regarding SNF for rehab versus LTC, and how this relates to hospice. I explained that a patient can have hospice in LTC, but not while in rehab.   Reviewed with daugher starting with outpatient palliative for ongoing support, and then transitioning to hospice when appropriate and when PTC placement is found. Daughter understands and agrees.    Objective:  Physical Exam Vitals reviewed.  Constitutional:      General: He is not in acute distress.    Comments: Frail, chronically ill-appearing  Pulmonary:     Effort: Pulmonary effort is normal.  Neurological:     Mental Status: He is alert.   Psychiatric:        Cognition and Memory: Cognition is impaired. Memory is impaired.              Palliative Medicine Assessment & Plan   Assessment: Principal Problem:   Hypertensive urgency Active Problems:   Longstanding persistent atrial fibrillation (HCC)   Abnormal EKG   Elevated troponin level not due myocardial infarction   Noncompliance   Recurrent falls   Dementia (HCC)   Benign hypertension    Recommendations/Plan: Main goal is for patient to have a safe disposition Continue comfort-focused care Plan for outpatient palliative (while in rehab), then transition to hospice when LTC placement is found -  referral made to [facility] PMT will continue to follow     Code Status: DNR - comfort     Prognosis:  < 6 months  Discharge Planning: Skilled Nursing Facility for rehab with Palliative care service follow-up   Thank you for allowing the Palliative Medicine Team to assist in the care of this patient.   Time: 36 minutes  Detailed review of medical records (labs, imaging, vital signs), medically appropriate exam, discussed with treatment team, counseling and education to patient, family, & staff, documenting clinical information, coordination of care.    Ayako Tapanes B Addam Goeller, NP   Please contact Palliative Medicine Team phone at [phone] for questions and concerns.  For individual providers, please see AMION.

## 2024-03-07 NOTE — Progress Notes (Signed)
 [facility] 3e03 - [facility] Liaison Note  Notified by [facility] of patient/family request for [facility] Palliative services at [facility] after discharge.   Hospital Liaison will follow patient for discharge disposition.   Please call with any hospice or outpatient palliative care related questions.  Thank you for the opportunity to participate in this patients care.   Daphne Shed, LPN [facility] Liaison [phone]

## 2024-03-07 NOTE — TOC Progression Note (Addendum)
 Transition of Care [facility]) - Progression Note    Patient Details  Name: Michael Bailey MRN: [id number] Date of Birth: 27-Jul-1936  [facility]) CM/SW Contact  [name], [name] Phone Number: 03/07/2024, 10:51 AM  Clinical Narrative:   Shara approved for [facility] SNF. CSW notified facility and treatment team.   Approval dates 03/07/2024-03/09/2024.   11:16 AM CSW informed [name] that patient can discharge to them today. CSW notified treatment team. CSW spoke with patients daughter, Aguinaldo, about discharge to [place]. Rickel asked CSW about letter of incompetency for her to apply for LTC medicaid on patients behalf. CSW asked Financial Counseling for the letter. Per, financial counseling CSW to receive letter after noon today.   [place] stated patient may eventually need LTC placement but family cannot afford to pay it privately. CSW inquired if patient receives SSI or disability. Galvan stated patient does . CSW informed her that patients check can be overturned to the facility. CSW advised Mamaril to speak with facility social worker as well.  CSW asked Hospitalist about signing letter of incompetency. Per MD, determining incompetency has to be determined through the court. MD can determine capacity but capacity is transient. CSW inquired if financial counseling's letter stated patient lacks capacity for medical decision making rather would MD be able to sign it. MD informed [facility] leadership, Harlene Jes, of above conversation via secure chat. Per [facility] leadership, CSW will need to send letter to leadership to review. Per MD, they will not be signing the letter at this time.  Per [facility] leadership, CSW to advise Sterkel about pursing guardianship.   1:58 PM Per yanceyvile, they use [facility] for palliative care services. CSW informed [facility].   CSW called financial counseling for the letter, per [facility] they are working on the later and will get it to CSW today.   2:41 PM Bedside RN  provided patients daughter with form to petition for guardianship.   CSW will continue to follow.    Expected Discharge Plan: Skilled Nursing Facility Barriers to Discharge: Continued Medical Work up               Expected Discharge Plan and Services In-house Referral: Clinical Social Work, Hospice / Palliative Care     Living arrangements for the past 2 months: Single Family Home                                       Social Drivers of Health (SDOH) Interventions SDOH Screenings   Food Insecurity: No Food Insecurity (03/02/2024)  Housing: Low Risk  (03/02/2024)  Transportation Needs: No Transportation Needs (03/01/2024)  Utilities: Not At Risk (03/01/2024)  Financial Resource Strain: Medium Risk (04/30/2023)   Received from [facility]  Physical Activity: Insufficiently Active (04/30/2023)   [facility] from [facility]  Social Connections: Patient Unable To Answer (03/02/2024)  Stress: Stress Concern Present (04/30/2023)   Received from [facility]  Tobacco Use: Medium Risk (03/01/2024)    Readmission Risk Interventions     No data to display

## 2024-03-07 NOTE — Progress Notes (Signed)
 Mobility Specialist Progress Note:   03/07/24 1150  Mobility  Activity Ambulated with assistance  Level of Assistance Minimal assist, patient does 75% or more  Assistive Device Front wheel walker  Distance Ambulated (ft) 200 ft  Activity Response Tolerated well  Mobility Referral Yes  Mobility visit 1 Mobility  Mobility Specialist Start Time (ACUTE ONLY) 1150  Mobility Specialist Stop Time (ACUTE ONLY) 1210  Mobility Specialist Time Calculation (min) (ACUTE ONLY) 20 min   Pt agreeable to mobility session. Required up to minA to ambulate with RW. Pt needed constant cues for RW proximity and able to follow with time. No over unsteadiness noted. Pt left in chair with all needs met.   Therisa Rana Mobility Specialist Please contact via SecureChat or  Rehab office at [phone]

## 2024-03-07 NOTE — Progress Notes (Signed)
 Pt daughter previously had a POA, but has lost paperwork. Pt daughter is going to petition for guardianship .

## 2024-03-07 NOTE — Discharge Summary (Signed)
 Physician Discharge Summary   Patient: Michael Bailey MRN: [id number] DOB: [birth year]  Admit date:     03/01/2024  Discharge date: 03/07/24  Discharge Physician: Alejandro Marker, DO   PCP: Suanne Pfeiffer, NP   Recommendations at discharge:   Follow-up with PCP within 1 to 2 weeks and repeat CBC, CMP, mag, Phos within 1 week Follow-up with palliative care in outpatient setting and then transition to hospice as appropriate  Discharge Diagnoses: Principal Problem:   Hypertensive urgency Active Problems:   Longstanding persistent atrial fibrillation (HCC)   Abnormal EKG   Elevated troponin level not due myocardial infarction   Noncompliance   Recurrent falls   Dementia (HCC)   Benign hypertension  Resolved Problems:   * No resolved hospital problems. [facility] Course: The patient Michael Bailey is an 88 year old Caucasian male with a past medical history significant for but not limited to dementia, proximal atrial fibrillation, essential hypertension, hyperlipidemia, COPD by imaging, BPH, B12 deficiency, anxiety and other comorbidities who [facility].  Notably there is also concern for worsening dementia as patient lives alone prior to admission and has worsened over the last few months.  Notably that daughter has installed cameras inside and out of his house and he has had multiple falls, wandering behavior and hallucinations with his last fall being the night before admission.  He recently walked for the model and was found in the field and walked outside for rain but unable had to redirect him.  Patient is pleasantly demented so a subjective history is not obtained from him but per his daughter his main complaints were headache and chest discomfort.  Patient's daughter reports that sometimes when she comes out of his house he will place his hand on his chest and in the last 6 months he stopped taking the medications.  He is brought  to the ED and found to have behavioral changes and hypertensive urgency/emergency which is improved with medications.  Cardiology was consulted given that A-fib with a slow ventricular response and they recommended holding AV nodal blocking agents and recommended against oral anticoagulation given his underlying dementia, noncompliance of medications and recurrent falls.  Cardiology felt that could also not rule out ischemia given his high blood pressure and agitation but he did not clinically have a heart failure picture and recommended modifiable cardiovascular risk factors and did not recommend ischemic workup.  Palliative care has been consulted for further goals of care discussion and current plan for the daughter is find a safe disposition for her father and focus on comfort care approach and continuing palliative evaluation while in rehab and then transitioning to hospice once LTC placement is found.   PT/OT evaluating and recommending SNF and TOC consulted for assistance with discharge disposition; Currently the patient has been medically stable for discharge and now has a bed offer and insurance authorization.  He will be discharged today  Assessment and Plan:  HTN emergency - resolved: Max BP of 201/152, improved with IV hydralazine  and PO Clonidine , Losartan . He has hx of elevated trop, suspect likely elevated in setting of severe range HTN although downtrending. His prior home regimen before stopping meds was Losartan  25 mg, and Metoprolol  25 mg. C/w Hydralazine  10 mg IV q 4 hr prn for SBP > 180  -Changed to Amlodipine  10 mg daily considering issues with med adherence will require least monitoring. Continued home Metoprolol  for HTN/Afib initially however given his A-fib with slow ventricular response this  will be discontinued.  Cardiology recommends avoiding AV nodal blocking agents.  Continue monitor blood pressures per protocol.  Last blood pressure reading was a little elevated at 148/95. Will  discontinue telemetry monitoring now   Acute myocardial injury: C/o intermittent chest pain, sometimes placing hand over chest per family. EKG without overt ischemic changes. HS trop 66 -> 59. Likely demand in setting of his uncontrolled HTN which is improving. Suspect he has advanced underlying CAD although he is a poor candidate for any procedural intervention due to his issues with medication adherence, discussed this with the daughter.  Cardiology was consulted and recommending against ischemic workup at this time. Management directed at HTN per above.  Discontinue telemetry monitoring; Continues to have Intermittent CP but daughter thinks it is more related to Anxiety now   Dementia with worsening behavioral change, wandering behavior  Hx of multiple ground level falls.  On exam appears atraumatic. CT Head without acute process. Do not feel additional imaging needed at this time. Trial of Seroquel  25 mg nightly, discussed with daughter in agreement with plan.  Haldol  1 mg IV q 6 hr prn for agitation if not redirectable and risk to self or others  -PT / OT evaluation, TOC for placement as they are recommending SNF -Delirium precautions, fall precautions  -Palliative consulted for further goals of care discussion; U/A Negative  -Patient's daughter met with the Palliative Team and Daughter is very reasonable and realistic. Her main goal is to find a safe disposition for him, but she is very receptive to comfort-focused care and current plan is for outpatient palliative while in rehab and then transition to hospice once LTC placement is found; He is medically stable to D/C to SNF and has a bed offer.  Patient's daughter has accepted the bed at SNF in [place] and insurance authorization has been obtained and is medically stable for discharge   Paroxysmal A Fib but now with slow ventricular response: Continued home Metoprolol  until patient became bradycardic, no AC in setting of adherence issues and  multiple falls.  Cardiology evaluated and recommends avoiding AV nodal blocking agents.  Given that patient is unable to maintain his telemetry and given that he will likely transition to hospice once in LTC will discontinue telemetry monitoring and not check any more labs.  HLD: Not taking cholesterol lowering agents   CKD2 / Metabolic Acidosis: Baseline Cr ~ 1.1, minimal elevation to 1.3 at admission. Check PVR with hx BPH. -BUN/Cr Trend: Recent Labs  Lab 03/01/24 1340 03/02/24 0747 03/03/24 0851  BUN 15 13 18   CREATININE 1.33* 1.18 1.07  -Slight Metabolic Acidosis is improved with a CO2 of 21, anion gap of 12, chloride level of 104 on last check.C/w Sodium Bicarbonate  650 mg po BID for now -Avoid Nephrotoxic Medications, Contrast Dyes, Hypotension and Dehydration to Ensure Adequate Renal Perfusion and will need to Renally Adjust Meds. CTM  and Trend Renal Function carefully and repeat CMP within 1 week  Dysphagia: Patient was coughing w/ Drinking. Obtained SLP evaluation and MBS was done and the patient demonstrated mild oral and moderate pharyngeal dysphagia with decreased lingual control, decreased laryngeal evaluation and hyoid excursion and reduced tongue base retraction and decreased glottal closure.  Patient was silently aspirating thin liquids and penetrated nectar thick liquids consistently.  The SLP wanted to give him a thickened liquid diet however daughter wanted the patient to have thin liquids and understand the risks of aspiration. C/w D3 thin liquid diet now with Aspiration Precautions.  COPD: C/w Nebs prn   Left Knee Pain: Ordered Voltaren  gel; If not improved with this will consider obtaining Knee Imaging.   BPH: Continue home Tamsulosin  0.4 mg po daily   B12 deficiency: B12 was 158. Continue IM injection of Cyanocobalamin  1000 mcg sq q30 days. Start po Cyanocobalamin  1000 mcg po Daily   Anxiety: Previously taking Xanax  up to 0.5 mg BID. Currently ordered for 0.25 mg  BID, Would use sparingly considering his falls.  Hyperbilirubinemia: T Bili is now gone from 1.3 -> 1.6 on last check. CTM and Trend and repeat CMP in the AM   Hypoalbuminemia: Patient's Albumin Lvl went from 4.1 -> 3.1 -> 3.3. CTM and Trend and repeat CMP in the AM  Consultants: Cardiology, palliative care medicine Procedures performed: As delineated as above   Disposition: Skilled nursing facility  Diet recommendation:  Dysphagia type 3 Thin Liquid  DISCHARGE MEDICATION: Allergies as of 03/07/2024   No Known Allergies      Medication List     TAKE these medications    acetaminophen  500 MG tablet Commonly known as: TYLENOL  Take 2 tablets (1,000 mg total) by mouth every 6 (six) hours as needed for mild pain (pain score 1-3).   albuterol  (2.5 MG/3ML) 0.083% nebulizer solution Commonly known as: PROVENTIL  Take 3 mLs (2.5 mg total) by nebulization every 4 (four) hours as needed for wheezing or shortness of breath.   amLODipine  10 MG tablet Commonly known as: NORVASC  Take 1 tablet (10 mg total) by mouth daily. Start taking on: March 08, 2024   cyanocobalamin  1000 MCG tablet Take 1 tablet (1,000 mcg total) by mouth daily. Start taking on: March 08, 2024   diclofenac  Sodium 1 % Gel Commonly known as: VOLTAREN  Apply 2 g topically 4 (four) times daily.   melatonin 3 MG Tabs tablet Take 2 tablets (6 mg total) by mouth at bedtime.   nitroGLYCERIN  0.4 MG SL tablet Commonly known as: NITROSTAT  Place 1 tablet (0.4 mg total) under the tongue every 5 (five) minutes as needed for chest pain.   polyethylene glycol 17 g packet Commonly known as: MIRALAX  / GLYCOLAX  Take 17 g by mouth daily as needed for mild constipation.   QUEtiapine  25 MG tablet Commonly known as: SEROQUEL  Take 1 tablet (25 mg total) by mouth at bedtime.   tamsulosin  0.4 MG Caps capsule Commonly known as: FLOMAX  Take 1 capsule (0.4 mg total) by mouth daily after supper. What changed: when to take  this        Contact information for after-discharge care     Destination     [facility] .   Service: Skilled Nursing Contact information: [address] [place]  [address] [phone]                    Discharge Exam: Filed Weights   03/06/24 0340 03/06/24 1400 03/07/24 0016  Weight: 88.5 kg 88.5 kg 89 kg   Vitals:   03/07/24 0808 03/07/24 1135  BP: (!) 148/95 (!) 154/86  Pulse: (!) 59 60  Resp: 17 18  Temp: 97.8 F (36.6 C) 97.8 F (36.6 C)  SpO2: 97% 100%   Examination: Physical Exam:  Constitutional: Elderly chronically ill-appearing Caucasian male who is hard of hearing and pleasantly demented no acute distress Respiratory: Diminished to auscultation bilaterally, no wheezing, rales, rhonchi or crackles. Normal respiratory effort and patient is not tachypenic. No accessory muscle use.  Unlabored breathing Cardiovascular: RRR, no murmurs / rubs / gallops. S1 and  S2 auscultated. No extremity edema Abdomen: Soft, non-tender, non-distended.  Bowel sounds positive.  GU: Deferred. 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 No induration; Warm and dry.  Neurologic: He is hard of hearing but cranial nerves II through XII grossly intact but he does have eyelid droopiness which is intact.  Romberg sign and cerebellar reflexes are not assessed  Psychiatric: Impaired judgment and insight and is awake and alert but pleasantly demented and confused  Condition at discharge: stable  The results of significant diagnostics from this hospitalization (including imaging, microbiology, ancillary and laboratory) are listed below for reference.   Imaging Studies: DG Swallowing Func-Speech Pathology Result Date: 03/03/2024 Table formatting from the original result was not included. Modified Barium Swallow Study Patient Details Name: Michael Bailey MRN: [id number]  Date of Birth: [birth year] Today's Date: 03/03/2024 HPI/PMH: HPI: Pt is a 88 y.o. male admitted from home with worsening dementia and chest pains. EKG showed afib without acute ST changes. Labs negative for UTI, flu, and RSV. Elevated tropinin levels, and hypertensive, likely d/t med noncompliance. CT no acute intracranial abnormality, age-related atrophy and moderately advanced cerebral white matter disease. CXR 8/8 No acute findings, right mid and bibasilar linear opacities, likely atelectasis/scarring,  similar to prior study.   bronchitis. PMH: paroxysmal afib, HLD, COPD, dementia.  Interval mild linear atelectasis or scarring in the right mid lung zone, stable mild changes of COPD and chronic bronchitis. Clinical Impression: Clinical Impression: Study limited by pt's excessive movements in and out of view and suspect possible cervical hyperlordosis. He demonstrated mild oral and moderate pharyngeal dysphagia with decreased lingual control, decreased laryngeal elevation and hyoid excursion, reduced tongue base retraction and decreased glottal closure. Epiglottis was partially inverted and at times no inversion observed. Pt silently aspirated thin liquids, penetrated nectar thick consistently (PAS 3). Verbal cue for volitional cough temporarily cleared vestibule however pt consistently penetrated during sub swallows from vallecular and pyriform sinus residue. Mastication was mildly delayed with solid. No significant esophageal findings during scan. SLP arrived at pt's room after study and pt's daughter and Palliative care present. Results shared and discussed options for po's with explanation, potential consequence of pt's dysphagia. Daughter stated she would like her father to continue with thin liquids accepting risks and Dys 3 texture. Given pt's cognitive impairments, he is unable to perform compensatory strategies. All daughter's questions answered and agreeable for ST to sign off at this time. Factors that may  increase risk of adverse event in presence of aspiration Noe & Lianne 2021): Factors that may increase risk of adverse event in presence of aspiration Noe & Lianne 2021): Reduced cognitive function Recommendations/Plan: Swallowing Evaluation Recommendations Swallowing Evaluation Recommendations Recommendations: PO diet PO Diet Recommendation: Dysphagia 3 (Mechanical soft); Thin liquids (Level 0) (dtr accepting aspiration risk) Liquid Administration via: Cup; Straw Medication Administration: Crushed with puree Supervision: Patient able to self-feed; Full supervision/cueing for swallowing strategies Swallowing strategies  : Slow rate; Small bites/sips; Hard cough after swallowing Postural changes: Position pt fully upright for meals Oral care recommendations: Oral care BID (2x/day) Treatment Plan Treatment Plan Treatment recommendations: No treatment recommended at this time (Dtr desires comfort feeds- SLP answered all her questions) Follow-up recommendations: No SLP follow up Functional status assessment: Patient has not had a recent decline in their functional status. Recommendations Recommendations for follow up therapy are one component of a multi-disciplinary discharge planning process, led by the attending physician.  Recommendations may  be updated based on patient status, additional functional criteria and insurance authorization. Assessment: Orofacial Exam: Orofacial Exam Oral Cavity - Dentition: Dentures, top; Dentures, bottom Orofacial Anatomy: WFL Oral Motor/Sensory Function: WFL Anatomy: Anatomy: Other (Comment) (possible hyper-lordosis) Boluses Administered: Boluses Administered Boluses Administered: Thin liquids (Level 0); Mildly thick liquids (Level 2, nectar thick); Moderately thick liquids (Level 3, honey thick); Puree; Solid  Oral Impairment Domain: Oral Impairment Domain Lip Closure: No labial escape Tongue control during bolus hold: Escape to lateral buccal cavity/floor of mouth Bolus  preparation/mastication: Slow prolonged chewing/mashing with complete recollection Bolus transport/lingual motion: Delayed initiation of tongue motion (oral holding) Oral residue: Trace residue lining oral structures Location of oral residue : Tongue Initiation of pharyngeal swallow : Pyriform sinuses  Pharyngeal Impairment Domain: Pharyngeal Impairment Domain Soft palate elevation: No bolus between soft palate (SP)/pharyngeal wall (PW) Laryngeal elevation: Partial superior movement of thyroid  cartilage/partial approximation of arytenoids to epiglottic petiole Anterior hyoid excursion: Partial anterior movement Epiglottic movement: No inversion Laryngeal vestibule closure: Incomplete, narrow column air/contrast in laryngeal vestibule Pharyngeal stripping wave : Present - diminished Pharyngeal contraction (A/P view only): N/A Pharyngoesophageal segment opening: Complete distension and complete duration, no obstruction of flow Tongue base retraction: Trace column of contrast or air between tongue base and PPW Pharyngeal residue: Collection of residue within or on pharyngeal structures Location of pharyngeal residue: Valleculae; Pyriform sinuses  Esophageal Impairment Domain: Esophageal Impairment Domain Esophageal clearance upright position: Complete clearance, esophageal coating Pill: No data recorded Penetration/Aspiration Scale Score: Penetration/Aspiration Scale Score 1.  Material does not enter airway: Solid; Moderately thick liquids (Level 3, honey thick) 3.  Material enters airway, remains ABOVE vocal cords and not ejected out: Mildly thick liquids (Level 2, nectar thick) 8.  Material enters airway, passes BELOW cords without attempt by patient to eject out (silent aspiration) : Thin liquids (Level 0) Compensatory Strategies: Compensatory Strategies Compensatory strategies: Yes Straw: Ineffective Ineffective Straw: Mildly thick liquid (Level 2, nectar thick) Other(comment): Effective (cough) Effective  Other(comment): Thin liquid (Level 0); Mildly thick liquid (Level 2, nectar thick)   General Information: No data recorded Diet Prior to this Study: Dysphagia 1 (pureed); Thin liquids (Level 0)   Temperature : Normal   Respiratory Status: WFL   Supplemental O2: None (Room air)   History of Recent Intubation: No  Behavior/Cognition: Alert; Cooperative; Confused; Pleasant mood; Requires cueing; Other (Comment) (very HOH) Self-Feeding Abilities: Able to self-feed Baseline vocal quality/speech: Normal Volitional Cough: Able to elicit Volitional Swallow: Able to elicit Exam Limitations: Excessive movement; Limited visibility Goal Planning: No data recorded No data recorded No data recorded No data recorded Consulted and agree with results and recommendations: Physician; Family member/caregiver; Pt unable/family or caregiver not available; Advanced practice provider; Other (comment) (Palliative care) Pain: Pain Assessment Pain Assessment: No/denies pain Faces Pain Scale: 0 Pain Intervention(s): Monitored during session End of Session: Start Time:SLP Start Time (ACUTE ONLY): 1329 Stop Time: SLP Stop Time (ACUTE ONLY): 1346 Time Calculation:SLP Time Calculation (min) (ACUTE ONLY): 17 min Charges: SLP Evaluations $ SLP Speech Visit: 1 Visit SLP Evaluations $BSS Swallow: 1 Procedure $MBS Swallow: 1 Procedure SLP visit diagnosis: SLP Visit Diagnosis: Dysphagia, oropharyngeal phase (R13.12) Past Medical History: Past Medical History: Diagnosis Date  Bilateral cataracts   COPD (chronic obstructive pulmonary disease) (HCC)   Hypertension  Past Surgical History: Past Surgical History: Procedure Laterality Date  CATARACT EXTRACTION    HAND SURGERY  1968  work related accident [name] 03/03/2024, 3:07 PM  DG CHEST PORT 1 VIEW  Result Date: 03/03/2024 EXAM: 1 VIEW XRAY OF THE CHEST 03/03/2024 09:28:00 AM COMPARISON: 03/01/2024 CLINICAL HISTORY: Dysphagia. FINDINGS: LUNGS AND PLEURA: Right mid and bibasilar linear  opacities, likely atelectasis or scarring. No focal pulmonary opacity. No pulmonary edema. No pleural effusion. No pneumothorax. HEART AND MEDIASTINUM: Cardiomediastinal silhouette is similar to prior. BONES AND SOFT TISSUES: No acute osseous abnormality. IMPRESSION: 1. No acute findings. 2. Right mid and bibasilar linear opacities, likely atelectasis/scarring, similar to prior study. Electronically signed by: Manford Cummins MD 03/03/2024 10:35 AM EDT RP Workstation: [id number]   CT Head Wo Contrast Result Date: 03/01/2024 EXAM: CT HEAD WITHOUT CONTRAST 03/01/2024 02:55:51 PM TECHNIQUE: CT of the head was performed without the administration of intravenous contrast. Automated exposure control, iterative reconstruction, and/or weight based adjustment of the mA/kV was utilized to reduce the radiation dose to as low as reasonably achievable. COMPARISON: CT of the head dated 01/30/2024. CLINICAL HISTORY: Head trauma, minor (Age >= 65y). Family brings patient in d/t worsening dementia. Stating patient is unsafe at home and is starting to wander outside and leaving stuff on the stove. Family states need help placement. FINDINGS: BRAIN AND VENTRICLES: No acute hemorrhage. Gray-white differentiation is preserved. No hydrocephalus. No extra-axial collection. No mass effect or midline shift. Age-related atrophy and moderately advanced cerebral white matter disease. ORBITS: The patient is status post left lens replacement. SINUSES: No acute abnormality. SOFT TISSUES AND SKULL: No acute soft tissue abnormality. No skull fracture. Moderate calcific atheromatous disease. IMPRESSION: 1. No acute intracranial abnormality. 2. Age-related atrophy and moderately advanced cerebral white matter disease. 3. Moderate calcific atheromatous disease. Electronically signed by: evalene coho 03/01/2024 03:12 PM EDT RP Workstation: [id number]   DG Chest 2 View Result Date: 03/01/2024 CLINICAL DATA:  Altered mental status. Worsening dementia.  Ex-smoker. EXAM: CHEST - 2 VIEW COMPARISON:  01/30/2024 FINDINGS: Stable borderline enlarged cardiac silhouette. Interval mild linear atelectasis or scarring in the right mid lung zone. Stable mild diffuse peribronchial thickening and borderline hyperexpansion of the lungs. Thoracic spine degenerative changes. IMPRESSION: 1. Interval mild linear atelectasis or scarring in the right mid lung zone. 2. Stable mild changes of COPD and chronic bronchitis. Electronically Signed   By: Elspeth Bathe M.D.   On: 03/01/2024 14:23   Microbiology: Results for orders placed or performed during the hospital encounter of 03/01/24  Resp panel by RT-PCR (RSV, Flu A&B, Covid) Anterior Nasal Swab     Status: None   Collection Time: 03/01/24  1:41 PM   Specimen: Anterior Nasal Swab  Result Value Ref Range Status   SARS Coronavirus 2 by RT PCR NEGATIVE NEGATIVE Final    Comment: (NOTE) SARS-CoV-2 target nucleic acids are NOT DETECTED.  The SARS-CoV-2 RNA is generally detectable in upper respiratory specimens during the acute phase of infection. The lowest concentration of SARS-CoV-2 viral copies this assay can detect is 138 copies/mL. A negative result does not preclude SARS-Cov-2 infection and should not be used as the sole basis for treatment or other patient management decisions. A negative result may occur with  improper specimen collection/handling, submission of specimen other than nasopharyngeal swab, presence of viral mutation(s) within the areas targeted by this assay, and inadequate number of viral copies(<138 copies/mL). A negative result must be combined with clinical observations, patient history, and epidemiological information. The expected result is Negative.  Fact Sheet for Patients:  BloggerCourse.com  Fact Sheet for Healthcare Providers:  SeriousBroker.it  This test is no t yet approved or cleared by the [place]  FDA and  has been  authorized for detection and/or diagnosis of SARS-CoV-2 by FDA under an Emergency Use Authorization (EUA). This EUA will remain  in effect (meaning this test can be used) for the duration of the COVID-19 declaration under Section 564(b)(1) of the Act, 21 U.S.C.section 360bbb-3(b)(1), unless the authorization is terminated  or revoked sooner.       Influenza A by PCR NEGATIVE NEGATIVE Final   Influenza B by PCR NEGATIVE NEGATIVE Final    Comment: (NOTE) The Xpert Xpress SARS-CoV-2/FLU/RSV plus assay is intended as an aid in the diagnosis of influenza from Nasopharyngeal swab specimens and should not be used as a sole basis for treatment. Nasal washings and aspirates are unacceptable for Xpert Xpress SARS-CoV-2/FLU/RSV testing.  Fact Sheet for Patients: [employer]  Fact Sheet for Healthcare Providers: SeriousBroker.it  This test is not yet approved or cleared by the [place]  FDA and has been authorized for detection and/or diagnosis of SARS-CoV-2 by FDA under an Emergency Use Authorization (EUA). This EUA will remain in effect (meaning this test can be used) for the duration of the COVID-19 declaration under Section 564(b)(1) of the Act, 21 U.S.C. section 360bbb-3(b)(1), unless the authorization is terminated or revoked.     Resp Syncytial Virus by PCR NEGATIVE NEGATIVE Final    Comment: (NOTE) Fact Sheet for Patients: BloggerCourse.com  Fact Sheet for Healthcare Providers: SeriousBroker.it  This test is not yet approved or cleared by the [place]  FDA and has been authorized for detection and/or diagnosis of SARS-CoV-2 by FDA under an Emergency Use Authorization (EUA). This EUA will remain in effect (meaning this test can be used) for the duration of the COVID-19 declaration under Section 564(b)(1) of the Act, 21 U.S.C. section 360bbb-3(b)(1), unless the  authorization is terminated or revoked.  Performed at [facility], [address], [place], [place] [address]    Labs: CBC: Recent Labs  Lab 03/01/24 1340 03/02/24 0747 03/03/24 0851  WBC 6.2 4.7 5.3  NEUTROABS 3.5 2.3 3.1  HGB 15.6 15.5 16.2  HCT 47.7 47.4 51.1  MCV 91.4 90.8 92.7  PLT 173 160 176   Basic Metabolic Panel: Recent Labs  Lab 03/01/24 1340 03/02/24 0747 03/03/24 0851  NA 140 139 137  K 4.0 4.3 4.8  CL 103 108 104  CO2 26 18* 21*  GLUCOSE 89 92 112*  BUN 15 13 18   CREATININE 1.33* 1.18 1.07  CALCIUM 9.6 9.0 9.0  MG  --  2.2 2.2  PHOS  --  4.2 4.0   Liver Function Tests: Recent Labs  Lab 03/01/24 1340 03/02/24 0747 03/03/24 0851  AST 17 15 16   ALT 8 10 12   ALKPHOS 76 53 55  BILITOT 0.9 1.3* 1.6*  PROT 7.2 6.3* 6.5  ALBUMIN 4.1 3.1* 3.3*   CBG: No results for input(s): GLUCAP in the last 168 hours.  Discharge time spent: greater than 30 minutes.  Signed: Alejandro Marker, DO [facility] 03/07/2024

## 2024-03-07 NOTE — TOC Transition Note (Signed)
 Transition of Care [facility]) - Discharge Note   Patient Details  Name: Michael Bailey MRN: [id number] Date of Birth: 1935/12/03  [name] CM/SW Contact:  [name], LCSWA Phone Number: 03/07/2024, 2:48 PM   Clinical Narrative:   Patient will DC to: [facility] SNF Anticipated DC date: 03/07/24  Family notified: [name] (dtr) Transport by: [facility]   Per MD patient ready for DC to [facility]. RN to call report prior to discharge [phone], room 617B). RN, patient, patient's family, and facility notified of DC. Discharge Summary and FL2 sent to facility. DC packet on chart. Ambulance transport requested for patient at 2:42PM.   CSW will sign off for now as social work intervention is no longer needed. Please consult us  again if new needs arise.      Final next level of care: Skilled Nursing Facility Barriers to Discharge: Barriers Resolved   Patient Goals and CMS Choice Patient states their goals for this hospitalization and ongoing recovery are:: To go to SNF          Discharge Placement              Patient chooses bed at: [facility] Patient to be transferred to facility by: [facility] Name of family member notified: Froman (dtr) Patient and family notified of of transfer: 03/07/24  Discharge Plan and Services Additional resources added to the After Visit Summary for   In-house Referral: Clinical Social Work, Hospice / Palliative Care                                   Social Drivers of Health (SDOH) Interventions SDOH Screenings   Food Insecurity: No Food Insecurity (03/02/2024)  Housing: Low Risk  (03/02/2024)  Transportation Needs: No Transportation Needs (03/01/2024)  Utilities: Not At Risk (03/01/2024)  Financial Resource Strain: Medium Risk (04/30/2023)   Received from [facility]  Physical Activity: Insufficiently Active (04/30/2023)   Received from [facility]  Social Connections: Patient Unable To Answer (03/02/2024)   Stress: Stress Concern Present (04/30/2023)   Received from [facility]  Tobacco Use: Medium Risk (03/01/2024)     Readmission Risk Interventions     No data to display

## 2024-04-13 ENCOUNTER — Institutional Professional Consult (permissible substitution): Admitting: Neurology
